# Patient Record
Sex: Female | Born: 1955 | Race: Black or African American | Hispanic: No | Marital: Single | Smoking: Never smoker
Health system: Southern US, Community
[De-identification: ages and names within clinical notes are randomized; demographics above are authoritative.]

## PROBLEM LIST (undated history)

## (undated) DIAGNOSIS — Z6841 Body Mass Index (BMI) 40.0 and over, adult: Secondary | ICD-10-CM

## (undated) DIAGNOSIS — I10 Essential (primary) hypertension: Secondary | ICD-10-CM

## (undated) DIAGNOSIS — G4730 Sleep apnea, unspecified: Secondary | ICD-10-CM

## (undated) HISTORY — DX: Body Mass Index (BMI) 40.0 and over, adult: Z684

## (undated) HISTORY — PX: LAPAROSCOPIC CHOLECYSTECTOMY: SUR755

## (undated) HISTORY — DX: Essential (primary) hypertension: I10

## (undated) HISTORY — PX: HYSTERECTOMY ABDOMINAL WITH SALPINGECTOMY: SHX6725

---

## 2013-05-26 HISTORY — PX: COLONOSCOPY: SHX174

## 2020-10-13 ENCOUNTER — Encounter: Payer: Self-pay | Admitting: Internal Medicine

## 2020-12-03 ENCOUNTER — Other Ambulatory Visit: Payer: Self-pay

## 2020-12-03 ENCOUNTER — Encounter: Payer: Self-pay | Admitting: Nurse Practitioner

## 2020-12-03 ENCOUNTER — Ambulatory Visit (INDEPENDENT_AMBULATORY_CARE_PROVIDER_SITE_OTHER): Payer: Medicare HMO | Admitting: Nurse Practitioner

## 2020-12-03 DIAGNOSIS — R935 Abnormal findings on diagnostic imaging of other abdominal regions, including retroperitoneum: Secondary | ICD-10-CM

## 2020-12-03 DIAGNOSIS — K219 Gastro-esophageal reflux disease without esophagitis: Secondary | ICD-10-CM | POA: Diagnosis not present

## 2020-12-03 DIAGNOSIS — K5900 Constipation, unspecified: Secondary | ICD-10-CM

## 2020-12-03 DIAGNOSIS — R109 Unspecified abdominal pain: Secondary | ICD-10-CM | POA: Insufficient documentation

## 2020-12-03 DIAGNOSIS — R1084 Generalized abdominal pain: Secondary | ICD-10-CM

## 2020-12-03 NOTE — Patient Instructions (Signed)
Your health issues we discussed today were:   Abdominal pain with known history of GERD (reflux/heartburn) and constipation: 1. Start taking over-the-counter Colace 100 mg once a day 2. You can use MiraLAX 1 dose 1-2 times a day as needed for any continued straining or hard stools 3. I have sent a new prescription for omeprazole 40 mg to take twice a day (you are currently on 20 mg once a day) 4. Take this pursing in the morning before you eat and 30 minutes before your last meal the day 5. We will request your previous colonoscopy from [facility] in [place], [place] 6. Call us for any worsening or severe symptoms  Abnormal CT of your liver: 1. I have started with some basic labs to evaluate your liver function and to check for hepatitis viruses 2. Someone will call to schedule the ultrasound of your liver to evaluate for how much scarring is there 3. Further recommendations will follow  Overall I recommend:  1. Continue other current medications 2. Return for follow-up in 4 weeks 3. Call us for any questions or concerns   ---------------------------------------------------------------  I am glad you have gotten your COVID-19 vaccination!  Even though you are fully vaccinated you should continue to follow CDC and state/local guidelines.  ---------------------------------------------------------------   At [facility] we value your feedback. You may receive a survey about your visit today. Please share your experience as we strive to create trusting relationships with our patients to provide genuine, compassionate, quality care.  We appreciate your understanding and patience as we review any laboratory studies, imaging, and other diagnostic tests that are ordered as we care for you. Our office policy is 5 business days for review of these results, and any emergent or urgent results are addressed in a timely manner for your best interest. If you do not hear from  our office in 1 week, please contact us.   We also encourage the use of MyChart, which contains your medical information for your review as well. If you are not enrolled in this feature, an access code is on this after visit summary for your convenience. Thank you for allowing [place] to be involved in your care.  It was great to see you today!  I hope you have a Merry Christmas and Happy Holidays!!

## 2020-12-03 NOTE — Progress Notes (Signed)
Referring Provider: Monico Blitz, MD Primary Care Physician:  Monico Blitz, MD Primary GI:  Dr. Abbey Chatters  Chief Complaint  Patient presents with  . Abdominal Pain    Left/right side pain, comes goes    HPI:   Amanda Hutchinson is a 64 y.o. female who presents on referral from primary care for abdominal pain.  Reviewed information provided with referral including primary care office visit dated 09/29/2020 wherein she complained of abdominal pain that was sudden in onset and persistent for about 1 month and increasing, described as moderate. Per PCP notes colonoscopy up to date 2014 and "normal". PCP notes also just irritable bowel syndrome in her past history. She apparently had a CT at [facility], although no report could be found in her forwarded documents.  Review of care everywhere finds a CT of the abdomen and pelvis with contrast dated 10/09/2020 with no acute intra-abdominal or pelvic pathology, no bowel obstruction, normal appendix. Noted fatty liver with findings of cirrhosis (specifically fatty infiltration with slight irregular liver contour). A 15 mm rounded hypodense structure to the left of the aorta may represent mildly enlarged lymph node and follow-up imaging recommended.  No liver function testing can be found in Care Everywhere.  No history of colonoscopy or EGD in our system.  Today she states she is doing okay overall. States pain started suddenly about 3 months ago, located generalized abdomen, sometimes sharp and sometimes dull, intermittent, occurs almost every day (more frequent then when it started). Identified trigger includes laying on her stomach, no identified alleviating factors; generally self-resolves. Typically lasts up to several hours. She has GERD, takes omeprazole 20 mg daily which helps somewhat. Still with breakthrough symptoms. Has a bowel movement about once a day to every other day. Stools variable from hard to soft, intermittent straining. Denies N/V,  hematochezia, melena, fever, chills, unintentional weight loss.  She states she has had fatty liver disease "for a long long time"  Had a colonoscopy in 2014 in [place], [place]. Only result she can remember is "small hernia". Can't remember repeat interval.  Denies NSAIDs. Previously on Linzess and stopped it this year.  Past Medical History:  Diagnosis Date  . BMI 40.0-44.9, adult ([place])   . Hypertension     Past Surgical History:  Procedure Laterality Date  . HYSTERECTOMY ABDOMINAL WITH SALPINGECTOMY    . LAPAROSCOPIC CHOLECYSTECTOMY      Current Outpatient Medications  Medication Sig Dispense Refill  . albuterol (VENTOLIN HFA) 108 (90 Base) MCG/ACT inhaler Inhale into the lungs every 6 (six) hours as needed for wheezing or shortness of breath.    Marland Kitchen amLODipine (NORVASC) 5 MG tablet Take 5 mg by mouth daily.    Marland Kitchen ammonium lactate (AMLACTIN) 12 % cream Apply topically in the morning and at bedtime.    Marland Kitchen atenolol (TENORMIN) 25 MG tablet Take by mouth daily.    . Azelastine HCl (ASTELIN NA) Place into the nose.    . benzonatate (TESSALON) 100 MG capsule Take by mouth 3 (three) times daily as needed for cough.    . Cholecalciferol (VITAMIN D) 50 MCG (2000 UT) CAPS Take by mouth.    . clobetasol cream (TEMOVATE) 3.66 % Apply 1 application topically 2 (two) times daily.    Marland Kitchen conjugated estrogens (PREMARIN) vaginal cream Place 1 Applicatorful vaginally daily.    . Diclofenac Sodium (DICLOFONO) 1.6 % GEL Apply topically.    Marland Kitchen estradiol (ESTRACE) 0.5 MG tablet Take 0.5 mg by mouth every other day.    Marland Kitchen  fluticasone (FLONASE) 50 MCG/ACT nasal spray Place 1 spray into both nostrils daily.    Marland Kitchen gabapentin (NEURONTIN) 100 MG capsule Take 100 mg by mouth at bedtime.    . gabapentin (NEURONTIN) 800 MG tablet Take 800 mg by mouth 3 (three) times daily.    Marland Kitchen HYDROcodone-acetaminophen (NORCO) 7.5-325 MG tablet Take 1 tablet by mouth in the morning, at noon, and at bedtime.    . Ibuprofen-Famotidine  800-26.6 MG TABS Take by mouth.    . levocetirizine (XYZAL) 5 MG tablet Take 5 mg by mouth every evening.    . Milnacipran (SAVELLA) 50 MG TABS tablet Take 50 mg by mouth 2 (two) times daily.    Marland Kitchen neomycin-bacitracin-polymyxin (NEOSPORIN) ointment Apply 1 application topically every 12 (twelve) hours.    Marland Kitchen omeprazole (PRILOSEC) 20 MG capsule Take 20 mg by mouth daily.    Marland Kitchen linaclotide (LINZESS) 145 MCG CAPS capsule Take 145 mcg by mouth daily before breakfast. (Patient not taking: Reported on 12/03/2020)     No current facility-administered medications for this visit.    Allergies as of 12/03/2020 - Review Complete 12/03/2020  Allergen Reaction Noted  . Ace inhibitors  03/13/2019  . Chlorthalidone  03/14/2019  . Diphenhydramine hcl  03/13/2019  . Sulfamethoxazole-trimethoprim  03/13/2019  . Tramadol  03/13/2019  . Oxycodone Rash and Other (See Comments) 03/13/2019    Family History  Problem Relation Age of Onset  . Colon cancer Neg Hx   . Liver disease Neg Hx     Social History   Socioeconomic History  . Marital status: Single    Spouse name: Not on file  . Number of children: Not on file  . Years of education: Not on file  . Highest education level: Not on file  Occupational History  . Not on file  Tobacco Use  . Smoking status: Never Smoker  . Smokeless tobacco: Never Used  Substance and Sexual Activity  . Alcohol use: Never  . Drug use: Never  . Sexual activity: Not on file  Other Topics Concern  . Not on file  Social History Narrative  . Not on file   Social Determinants of Health   Financial Resource Strain: Not on file  Food Insecurity: Not on file  Transportation Needs: Not on file  Physical Activity: Not on file  Stress: Not on file  Social Connections: Not on file    Subjective: Review of Systems  Constitutional: Negative for chills, fever, malaise/fatigue and weight loss.  HENT: Negative for congestion and sore throat.   Respiratory: Negative for  cough and shortness of breath.   Cardiovascular: Negative for chest pain and palpitations.  Gastrointestinal: Positive for abdominal pain, constipation and heartburn. Negative for blood in stool, diarrhea, melena, nausea and vomiting.  Musculoskeletal: Negative for joint pain and myalgias.  Skin: Negative for rash.  Neurological: Negative for dizziness and weakness.  Endo/Heme/Allergies: Does not bruise/bleed easily.  Psychiatric/Behavioral: Negative for depression. The patient is not nervous/anxious.   All other systems reviewed and are negative.    Objective: BP (!) 146/80   Pulse 72   Temp (!) 96.9 F (36.1 C)   Ht 5\' 3"  (1.6 m)   Wt 239 lb 9.6 oz (108.7 kg)   BMI 42.44 kg/m  Physical Exam Vitals and nursing note reviewed.  Constitutional:      General: She is not in acute distress.    Appearance: Normal appearance. She is well-developed. She is obese. She is not ill-appearing, toxic-appearing or diaphoretic.  HENT:     Head: Normocephalic and atraumatic.     Nose: No congestion or rhinorrhea.  Eyes:     General: No scleral icterus. Cardiovascular:     Rate and Rhythm: Normal rate and regular rhythm.     Heart sounds: Normal heart sounds.  Pulmonary:     Effort: Pulmonary effort is normal. No respiratory distress.     Breath sounds: Normal breath sounds.  Abdominal:     General: Bowel sounds are normal.     Palpations: Abdomen is soft. There is no hepatomegaly, splenomegaly or mass.     Tenderness: There is generalized abdominal tenderness. There is no guarding or rebound.     Hernia: No hernia is present.  Skin:    General: Skin is warm and dry.     Coloration: Skin is not jaundiced.     Findings: No rash.  Neurological:     General: No focal deficit present.     Mental Status: She is alert and oriented to person, place, and time.  Psychiatric:        Attention and Perception: Attention normal.        Mood and Affect: Mood normal.        Speech: Speech normal.         Behavior: Behavior normal.        Thought Content: Thought content normal.        Cognition and Memory: Cognition and memory normal.      Assessment:  Very pleasant 64 year old female presents for abdominal pain in the setting of GERD and constipation.  Also noted abnormal CT with fatty liver disease (which she states she has had for "many years") now with findings suggestive of early cirrhosis.  No red flag/warning signs or symptoms.  Abdominal pain with GERD: Omeprazole 20 mg somewhat effective but not very effective.  She does have intermittent GERD symptoms and when starting PPI her abdominal pain did improve some.  At this point I will have her increase her PPI to 40 mg twice daily to see if she can squeeze any more effect out of this.  GERD is a potential contributing etiology to her pain.  Abdominal pain with constipation: Noted bowel movement every 1 to 2 days although sometimes hard, sometimes consistent with [place] 4.  Previously on Linzess so she stopped this.  She is not currently on any significant constipation medications.  At this point I will trial her on Colace 100 mg once daily and recommend MiraLAX 1-2 times a day as needed for breakthrough constipation.  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  Abdominal pain is likely multifactorial in nature.  As per above, we will try to better treat her GERD and her constipation to see if we can get any improvement in her pain.  I will also further work-up her abnormal CT concerning for early cirrhosis.  I will also request her last colonoscopy from around 2014 for informational purposes and decide if colonoscopy is needed.  Abnormal CT: Care everywhere, fatty liver disease with slightly nodular contour suggestive of early cirrhosis.  She states she has had fatty liver disease for a number of years.  No LFTs could  be found in our system or in Care Everywhere.  At this point I will plan a somewhat limited liver work-up including CBC, CMP, viral hepatitis panel.  I will also plan to  schedule a right upper quadrant ultrasound with elastography to gauge degree of fibrosis.  Further recommendations to follow.  She may need more extensive liver work-up with autoimmune serologies, ferritin, ceruloplasmin, possibly alpha-1 antitrypsin levels pending results.   Plan: 1. Request previous colonoscopy from 2014 from [facility] 2. Increase omeprazole 40 mg twice daily 3. Colace 100 mg once daily 4. MiraLAX 1-2 times a day as needed for breakthrough constipation 5. CBC, CMP, viral hepatitis panel including hepatitis A/B/C 6. Right upper quadrant ultrasound with liver elastography 7. Follow-up in 4 weeks 8. Call for worsening or severe symptoms    Thank you for allowing [place] to participate in the care of [place], DNP, AGNP-C Adult & Gerontological Nurse Practitioner [facility]   12/08/2020 10:38 AM   Disclaimer: This note was dictated with voice recognition software. Similar sounding words can inadvertently be transcribed and may not be corrected upon review.

## 2020-12-04 ENCOUNTER — Encounter: Payer: Self-pay | Admitting: Internal Medicine

## 2020-12-04 ENCOUNTER — Telehealth: Payer: Self-pay | Admitting: Internal Medicine

## 2020-12-04 DIAGNOSIS — K219 Gastro-esophageal reflux disease without esophagitis: Secondary | ICD-10-CM

## 2020-12-04 NOTE — Telephone Encounter (Signed)
Please tell the patient she is currently on Prilosec (omeprazole) and my intention was to increase the dose (not change to pantoprazole). Is she wanting to stay on the lower dose?

## 2020-12-04 NOTE — Progress Notes (Signed)
Cc'ed to pcp °

## 2020-12-04 NOTE — Telephone Encounter (Signed)
[phone] patient, was here yesterday,and called and  wants to stay on pantoprozole   Uses [facility] in [place]

## 2020-12-04 NOTE — Telephone Encounter (Signed)
Randall Hiss this pt phoned this morning stating she wants to stay on Pantoprozole but her chart states Omeprazole but in any case nothing was sent in. Please advise. (she wants it sent to [facility] in [place]).

## 2020-12-07 NOTE — Telephone Encounter (Signed)
Amanda Hutchinson I phoned and spoke with the patient she was on Omperazole but it made her sick therefore she was prescribed Pantoprozole and wants to stay on it and nothing is wrong with the dose of that which she is taking. Please send in Rx for Pantoprozole.

## 2020-12-08 ENCOUNTER — Ambulatory Visit (HOSPITAL_COMMUNITY)
Admission: RE | Admit: 2020-12-08 | Discharge: 2020-12-08 | Disposition: A | Discharge status: Home / Self Care | Payer: Medicare HMO | Source: Ambulatory Visit | Attending: Nurse Practitioner | Admitting: Nurse Practitioner

## 2020-12-08 ENCOUNTER — Other Ambulatory Visit: Payer: Self-pay

## 2020-12-08 DIAGNOSIS — K219 Gastro-esophageal reflux disease without esophagitis: Secondary | ICD-10-CM | POA: Insufficient documentation

## 2020-12-08 DIAGNOSIS — R935 Abnormal findings on diagnostic imaging of other abdominal regions, including retroperitoneum: Secondary | ICD-10-CM | POA: Diagnosis present

## 2020-12-08 DIAGNOSIS — R1084 Generalized abdominal pain: Secondary | ICD-10-CM | POA: Diagnosis present

## 2020-12-08 DIAGNOSIS — K5900 Constipation, unspecified: Secondary | ICD-10-CM

## 2020-12-09 LAB — CBC WITH DIFFERENTIAL/PLATELET
Absolute Monocytes: 531 cells/uL (ref 200–950)
Basophils Absolute: 32 cells/uL (ref 0–200)
Basophils Relative: 0.5 %
Eosinophils Absolute: 474 cells/uL (ref 15–500)
Eosinophils Relative: 7.4 %
HCT: 36.4 % (ref 35.0–45.0)
Hemoglobin: 12.1 g/dL (ref 11.7–15.5)
Lymphs Abs: 2221 cells/uL (ref 850–3900)
MCH: 29.1 pg (ref 27.0–33.0)
MCHC: 33.2 g/dL (ref 32.0–36.0)
MCV: 87.5 fL (ref 80.0–100.0)
MPV: 9.7 fL (ref 7.5–12.5)
Monocytes Relative: 8.3 %
Neutro Abs: 3142 cells/uL (ref 1500–7800)
Neutrophils Relative %: 49.1 %
Platelets: 361 10*3/uL (ref 140–400)
RBC: 4.16 10*6/uL (ref 3.80–5.10)
RDW: 12.4 % (ref 11.0–15.0)
Total Lymphocyte: 34.7 %
WBC: 6.4 10*3/uL (ref 3.8–10.8)

## 2020-12-09 LAB — COMPREHENSIVE METABOLIC PANEL
AG Ratio: 1.7 (calc) (ref 1.0–2.5)
ALT: 22 U/L (ref 6–29)
AST: 18 U/L (ref 10–35)
Albumin: 4.3 g/dL (ref 3.6–5.1)
Alkaline phosphatase (APISO): 110 U/L (ref 37–153)
BUN: 17 mg/dL (ref 7–25)
CO2: 29 mmol/L (ref 20–32)
Calcium: 8.9 mg/dL (ref 8.6–10.4)
Chloride: 106 mmol/L (ref 98–110)
Creat: 0.92 mg/dL (ref 0.50–0.99)
Globulin: 2.6 g/dL (calc) (ref 1.9–3.7)
Glucose, Bld: 88 mg/dL (ref 65–99)
Potassium: 4.4 mmol/L (ref 3.5–5.3)
Sodium: 142 mmol/L (ref 135–146)
Total Bilirubin: 0.3 mg/dL (ref 0.2–1.2)
Total Protein: 6.9 g/dL (ref 6.1–8.1)

## 2020-12-09 LAB — HEPATITIS B SURFACE ANTIBODY,QUALITATIVE: Hep B S Ab: BORDERLINE — AB

## 2020-12-09 LAB — HEPATITIS A ANTIBODY, TOTAL: Hepatitis A AB,Total: NONREACTIVE

## 2020-12-09 LAB — HEPATITIS C ANTIBODY
Hepatitis C Ab: NONREACTIVE
SIGNAL TO CUT-OFF: 0.01 (ref <1.00)

## 2020-12-09 LAB — HEPATITIS B SURFACE ANTIGEN: Hepatitis B Surface Ag: NONREACTIVE

## 2020-12-09 LAB — HEPATITIS B CORE ANTIBODY, TOTAL: Hep B Core Total Ab: NONREACTIVE

## 2020-12-09 NOTE — Telephone Encounter (Signed)
Spoke with pt. Pt found the bottle of Pantoprazole that she was taking. Her PCP prescribed Pantoprazole 40 mg once daily. Pt would like to know if the dose can be increased to twice daily since she was still belching and having liquid come back up in her mouth.

## 2020-12-09 NOTE — Telephone Encounter (Signed)
I don't see any history of Protonix in her chart. Not sure who Rx'd it. I'm ok with Protonix, but would like to know what dose/frequency she had previously taken that was effective.

## 2020-12-10 MED ORDER — PANTOPRAZOLE SODIUM 40 MG PO TBEC: 40.0000 mg | DELAYED_RELEASE_TABLET | Freq: Two times a day (BID) | ORAL | 5 refills | Status: DC

## 2020-12-10 NOTE — Telephone Encounter (Signed)
Yes, we can try this. I sent in an Rx for this to her pharmacy. Call with progress report in 2-4 weeks.

## 2020-12-10 NOTE — Telephone Encounter (Signed)
Left a detailed message and asked pt to call in 2-4 weeks with an update.

## 2020-12-10 NOTE — Addendum Note (Signed)
Addended by: Gordy Levan, Signa Cheek A on: 12/10/2020 09:52 AM   Modules accepted: Orders

## 2021-01-26 ENCOUNTER — Ambulatory Visit: Payer: Medicare HMO | Admitting: Nurse Practitioner

## 2021-01-26 ENCOUNTER — Encounter: Payer: Self-pay | Admitting: Nurse Practitioner

## 2021-01-26 ENCOUNTER — Other Ambulatory Visit: Payer: Self-pay

## 2021-01-26 ENCOUNTER — Encounter: Payer: Self-pay | Admitting: *Deleted

## 2021-01-26 VITALS — BP 141/68 | HR 68 | Temp 97.1°F | Ht 63.0 in | Wt 243.2 lb

## 2021-01-26 DIAGNOSIS — R1310 Dysphagia, unspecified: Secondary | ICD-10-CM | POA: Insufficient documentation

## 2021-01-26 DIAGNOSIS — K5900 Constipation, unspecified: Secondary | ICD-10-CM | POA: Diagnosis not present

## 2021-01-26 DIAGNOSIS — K219 Gastro-esophageal reflux disease without esophagitis: Secondary | ICD-10-CM | POA: Diagnosis not present

## 2021-01-26 DIAGNOSIS — R195 Other fecal abnormalities: Secondary | ICD-10-CM | POA: Insufficient documentation

## 2021-01-26 DIAGNOSIS — R1319 Other dysphagia: Secondary | ICD-10-CM

## 2021-01-26 NOTE — Progress Notes (Signed)
Referring Provider: Monico Blitz, MD Primary Care Physician:  Monico Blitz, MD Primary GI:  Dr. Abbey Chatters  Chief Complaint  Patient presents with  . Gastroesophageal Reflux    Doing okay.    HPI:   Amanda Hutchinson is a 65 y.o. female who presents for follow-up on GERD and constipation.  Today she states she doing okay overall. GERD doing well on Protonix 40 mg bid. She has had issues "getting strangled" almost every meal now. She had this issue "a long time ago" but self-resolved. She has a chronic GERD for years. No regurgitation, food generally passes with time. Sometimes issues even with water. Also occasional pill dysphagia. Has associated odynophagia. Rare abdominal discomfort. She did have a couple episodes of dark stools after drinking black tea and a lot of greens. She is on diclofenac, no OTC NSAIDs/ASA powders. She does use intermittent cough syrup, mixes it up (not sure which she typically uses). Denies N/V, hematochezia, fever, chills, unintentional weight loss. Denies URI or flu-like symptoms. Denies loss of sense of taste or smell. The patient has received COVID-19 vaccination(s). Denies chest pain, dyspnea, dizziness, lightheadedness, syncope, near syncope. Denies any other upper or lower GI symptoms.  Past Medical History:  Diagnosis Date  . BMI 40.0-44.9, adult ([place])   . Hypertension     Past Surgical History:  Procedure Laterality Date  . HYSTERECTOMY ABDOMINAL WITH SALPINGECTOMY    . LAPAROSCOPIC CHOLECYSTECTOMY      Current Outpatient Medications  Medication Sig Dispense Refill  . albuterol (VENTOLIN HFA) 108 (90 Base) MCG/ACT inhaler Inhale into the lungs every 6 (six) hours as needed for wheezing or shortness of breath.    Marland Kitchen amLODipine (NORVASC) 5 MG tablet Take 5 mg by mouth daily.    Marland Kitchen ammonium lactate (AMLACTIN) 12 % cream Apply topically in the morning and at bedtime.    Marland Kitchen atenolol (TENORMIN) 25 MG tablet Take by mouth daily.    . Azelastine HCl (ASTELIN  NA) Place into the nose.    . benzonatate (TESSALON) 100 MG capsule Take by mouth 3 (three) times daily as needed for cough.    . Cholecalciferol (VITAMIN D) 50 MCG (2000 UT) CAPS Take by mouth.    . clobetasol cream (TEMOVATE) 7.68 % Apply 1 application topically 2 (two) times daily.    Marland Kitchen conjugated estrogens (PREMARIN) vaginal cream Place 1 Applicatorful vaginally daily.    . Diclofenac Sodium (DICLOFONO) 1.6 % GEL Apply topically.    . docusate sodium (COLACE) 100 MG capsule Take 100 mg by mouth daily.    Marland Kitchen estradiol (ESTRACE) 0.5 MG tablet Take 0.5 mg by mouth every other day.    . fluticasone (FLONASE) 50 MCG/ACT nasal spray Place 1 spray into both nostrils daily.    Marland Kitchen gabapentin (NEURONTIN) 100 MG capsule Take 100 mg by mouth at bedtime.    . gabapentin (NEURONTIN) 800 MG tablet Take 800 mg by mouth 3 (three) times daily.    Marland Kitchen HYDROcodone-acetaminophen (NORCO) 7.5-325 MG tablet Take 1 tablet by mouth in the morning, at noon, and at bedtime.    . Ibuprofen-Famotidine 800-26.6 MG TABS Take by mouth.    . levocetirizine (XYZAL) 5 MG tablet Take 5 mg by mouth every evening.    . Milnacipran (SAVELLA) 50 MG TABS tablet Take 50 mg by mouth 2 (two) times daily.    Marland Kitchen neomycin-bacitracin-polymyxin (NEOSPORIN) ointment Apply 1 application topically every 12 (twelve) hours.    . pantoprazole (PROTONIX) 40 MG tablet Take 1  tablet (40 mg total) by mouth 2 (two) times daily before a meal. 60 tablet 5   No current facility-administered medications for this visit.    Allergies as of 01/26/2021 - Review Complete 01/26/2021  Allergen Reaction Noted  . Ace inhibitors  03/13/2019  . Chlorthalidone  03/14/2019  . Diphenhydramine hcl  03/13/2019  . Sulfamethoxazole-trimethoprim  03/13/2019  . Tramadol  03/13/2019  . Oxycodone Rash and Other (See Comments) 03/13/2019    Family History  Problem Relation Age of Onset  . Colon cancer Neg Hx   . Liver disease Neg Hx     Social History    Socioeconomic History  . Marital status: Single    Spouse name: Not on file  . Number of children: Not on file  . Years of education: Not on file  . Highest education level: Not on file  Occupational History  . Not on file  Tobacco Use  . Smoking status: Never Smoker  . Smokeless tobacco: Never Used  Substance and Sexual Activity  . Alcohol use: Never  . Drug use: Never  . Sexual activity: Not on file  Other Topics Concern  . Not on file  Social History Narrative  . Not on file   Social Determinants of Health   Financial Resource Strain: Not on file  Food Insecurity: Not on file  Transportation Needs: Not on file  Physical Activity: Not on file  Stress: Not on file  Social Connections: Not on file    Subjective: Review of Systems  Constitutional: Negative for chills, fever, malaise/fatigue and weight loss.  HENT: Negative for congestion and sore throat.   Respiratory: Negative for cough and shortness of breath.   Cardiovascular: Negative for chest pain and palpitations.  Gastrointestinal: Negative for abdominal pain, blood in stool, diarrhea, heartburn, melena, nausea and vomiting.       Dysphagia  Musculoskeletal: Negative for joint pain and myalgias.  Skin: Negative for rash.  Neurological: Negative for dizziness and weakness.  Endo/Heme/Allergies: Does not bruise/bleed easily.  Psychiatric/Behavioral: Negative for depression. The patient is not nervous/anxious.   All other systems reviewed and are negative.    Objective: BP (!) 141/68   Pulse 68   Temp (!) 97.1 F (36.2 C)   Ht _0  (1.6 m)   Wt 243 lb 3.2 oz (110.3 kg)   BMI 43.08 kg/m  Physical Exam Vitals and nursing note reviewed.  Constitutional:      General: She is not in acute distress.    Appearance: Normal appearance. She is well-developed. She is obese. She is not ill-appearing, toxic-appearing or diaphoretic.  HENT:     Head: Normocephalic and atraumatic.     Nose: No congestion or  rhinorrhea.  Eyes:     General: No scleral icterus. Cardiovascular:     Rate and Rhythm: Normal rate and regular rhythm.     Heart sounds: Normal heart sounds.  Pulmonary:     Effort: Pulmonary effort is normal. No respiratory distress.     Breath sounds: Normal breath sounds.  Abdominal:     General: Bowel sounds are normal.     Palpations: Abdomen is soft. There is no hepatomegaly, splenomegaly or mass.     Tenderness: There is no abdominal tenderness. There is no guarding or rebound.     Hernia: No hernia is present.  Skin:    General: Skin is warm and dry.     Coloration: Skin is not jaundiced.     Findings: No rash.  Neurological:     General: No focal deficit present.     Mental Status: She is alert and oriented to person, place, and time.  Psychiatric:        Attention and Perception: Attention normal.        Mood and Affect: Mood normal.        Speech: Speech normal.        Behavior: Behavior normal.        Thought Content: Thought content normal.        Cognition and Memory: Cognition and memory normal.      Assessment:  Very pleasant 65 year old female presents for follow-up on GERD and constipation.  Currently she is doing well in these regards.  However, she has developed some solid food and pill dysphagia associated with odynophagia as described above.  Dysphagia: Almost every meal, solid foods and occasionally pills.  Sometimes even water.  She has had this before and cannot remember she had a procedure for it.  She has not had an issue in a number of years.  She has have a chronic multiyear history of GERD.  We will proceed with endoscopy to evaluate and treat  GERD: Currently doing well on her PPI regimen.  Recommend continue current meds and call us for problems  Dark stools: She does note dark stools, but does eat a lot of greens and is not sure if this is related.  She is on meloxicam and ibuprofen (combined with famotidine) so erosive reflux esophagitis,  gastritis, duodenitis would not be unreasonable to expect.  Further evaluation by EGD  Constipation: Currently seems to be resolved with no issues described.   Proceed with EGD on propofol/MAC with Dr. Abbey Chatters on propofol/MAC in near future: the risks, benefits, and alternatives have been discussed with the patient in detail. The patient states understanding and desires to proceed.  ASA III   Plan: 1. Continue current medications 2. EGD as described above 3. Dysphagia 3 diet in the interim 4. Follow-up in 3 months 5. Call for worsening or severe symptoms    Thank you for allowing [place] to participate in the care of [place], DNP, AGNP-C Adult & Gerontological Nurse Practitioner [facility]   01/26/2021 11:54 AM   Disclaimer: This note was dictated with voice recognition software. Similar sounding words can inadvertently be transcribed and may not be corrected upon review.

## 2021-01-26 NOTE — Patient Instructions (Signed)
Your health issues we discussed today were:   GERD (reflux/heartburn): 1. I am glad you are doing well! 2. Continue taking your current medicines 3. Call us for any worsening symptoms  Dysphagia (swallowing difficulties): 1. I am printing information below related to soft foods to help prevent food from getting stuck 2. Try to eat softer foods while we wait for your procedure 3. We will schedule an upper endoscopy with possible dilation to help with your swallowing difficulties 4. If you get stuck and will not go forward, will not come back up, and last for more than 2 hours then proceed to the emergency room 5. Call us for any worsening or severe symptoms  Dark stools: 1. This could be due to the food you are eating or could be due to minor bleeding from your stomach 2. The upper endoscopy that we are doing for your swallowing difficulties will help evaluate for any problems 3. Call us if you have any worsening or severe symptoms.  Call us especially if you have worsening weakness, fatigue, shortness of breath, dizziness, passing out, nearly passing out, etc.  Overall I recommend:  1. Continue other current medications 2. Return for follow-up in 3 months 3. Call us for any questions or concerns   ---------------------------------------------------------------  I am glad you have gotten your COVID-19 vaccination!  Even though you are fully vaccinated you should continue to follow CDC and state/local guidelines.  ---------------------------------------------------------------   At [facility] we value your feedback. You may receive a survey about your visit today. Please share your experience as we strive to create trusting relationships with our patients to provide genuine, compassionate, quality care.  We appreciate your understanding and patience as we review any laboratory studies, imaging, and other diagnostic tests that are ordered as we care for you. Our office  policy is 5 business days for review of these results, and any emergent or urgent results are addressed in a timely manner for your best interest. If you do not hear from our office in 1 week, please contact us.   We also encourage the use of MyChart, which contains your medical information for your review as well. If you are not enrolled in this feature, an access code is on this after visit summary for your convenience. Thank you for allowing [place] to be involved in your care.  It was great to see you today!  I hope you have a safe and warm winter!!

## 2021-01-27 ENCOUNTER — Encounter: Payer: Self-pay | Admitting: *Deleted

## 2021-02-25 NOTE — Patient Instructions (Signed)
Amanda Hutchinson  02/25/2021     @PREFPERIOPPHARMACY @   Your procedure is scheduled on  03/02/2021.   Report to [facility] at  0700  A.M.   Call this number if you have problems the morning of surgery:  [phone]     Remember:  Follow the diet instructions given to you by the office.                      Take these medicines the morning of surgery with A SIP OF WATER  Amlodipine, atenolol, hydrocodone(if needed), savella, protonix.  Use your inhaler before you come and bring your rescue inhaler with you.    Please brush your teeth.  Do not wear jewelry, make-up or nail polish.  Do not wear lotions, powders, or perfumes, or deodorant.  Do not shave 48 hours prior to surgery.  Men may shave face and neck.  Do not bring valuables to the hospital.  [facility] is not responsible for any belongings or valuables.  Contacts, dentures or bridgework may not be worn into surgery.  Leave your suitcase in the car.  After surgery it may be brought to your room.  For patients admitted to the hospital, discharge time will be determined by your treatment team.  Patients discharged the day of surgery will not be allowed to drive home and must have someone with them for 24 hours.     Special instructions:  DO NOT smoke tobacco or vape the morning of your procedure.   Please read over the following fact sheets that you were given. Anesthesia Post-op Instructions and Care and Recovery After Surgery       [email or web address]">  Esophageal Dilatation Esophageal dilatation, also called esophageal dilation, is a procedure to widen or open a blocked or narrowed part of the esophagus. The esophagus is the part of the body that moves food and liquid from the mouth to the stomach. You may need this procedure if:  You have a buildup of scar tissue in your esophagus that makes it difficult, painful, or impossible  to swallow. This can be caused by gastroesophageal reflux disease (GERD).  You have cancer of the esophagus.  There is a problem with how food moves through your esophagus. In some cases, you may need this procedure repeated at a later time to dilate the esophagus gradually. Tell a health care provider about:  Any allergies you have.  All medicines you are taking, including vitamins, herbs, eye drops, creams, and over-the-counter medicines.  Any problems you or family members have had with anesthetic medicines.  Any blood disorders you have.  Any surgeries you have had.  Any medical conditions you have.  Any antibiotic medicines you are required to take before dental procedures.  Whether you are pregnant or may be pregnant. What are the risks? Generally, this is a safe procedure. However, problems may occur, including:  Bleeding due to a tear in the lining of the esophagus.  A hole, or perforation, in the esophagus. What happens before the procedure?  Ask your health care provider about: ? Changing or stopping your regular medicines. This is especially important if you are taking diabetes medicines or blood thinners. ? Taking medicines such as aspirin and ibuprofen. These medicines can thin your blood. Do not take these medicines unless your health care provider tells you to take them. ? Taking over-the-counter medicines, vitamins, herbs, and supplements.  Follow instructions from your health care provider  about eating or drinking restrictions.  Plan to have a responsible adult take you home from the hospital or clinic.  Plan to have a responsible adult care for you for the time you are told after you leave the hospital or clinic. This is important. What happens during the procedure?  You may be given a medicine to help you relax (sedative).  A numbing medicine may be sprayed into the back of your throat, or you may gargle the medicine.  Your health care provider may  perform the dilatation using various surgical instruments, such as: ? Simple dilators. This instrument is carefully placed in the esophagus to stretch it. ? Guided wire bougies. This involves using an endoscope to insert a wire into the esophagus. A dilator is passed over this wire to enlarge the esophagus. Then the wire is removed. ? Balloon dilators. An endoscope with a small balloon is inserted into the esophagus. The balloon is inflated to stretch the esophagus and open it up. The procedure may vary among health care providers and hospitals. What can I expect after the procedure?  Your blood pressure, heart rate, breathing rate, and blood oxygen level will be monitored until you leave the hospital or clinic.  Your throat may feel slightly sore and numb. This will get better over time.  You will not be allowed to eat or drink until your throat is no longer numb.  When you are able to drink, urinate, and sit on the edge of the bed without nausea or dizziness, you may be able to return home. Follow these instructions at home:  Take over-the-counter and prescription medicines only as told by your health care provider.  If you were given a sedative during the procedure, it can affect you for several hours. Do not drive or operate machinery until your health care provider says that it is safe.  Plan to have a responsible adult care for you for the time you are told. This is important.  Follow instructions from your health care provider about any eating or drinking restrictions.  Do not use any products that contain nicotine or tobacco, such as cigarettes, e-cigarettes, and chewing tobacco. If you need help quitting, ask your health care provider.  Keep all follow-up visits. This is important. Contact a health care provider if:  You have a fever.  You have pain that is not relieved by medicine. Get help right away if:  You have chest pain.  You have trouble breathing.  You have  trouble swallowing.  You vomit blood.  You have black, tarry, or bloody stools. These symptoms may represent a serious problem that is an emergency. Do not wait to see if the symptoms will go away. Get medical help right away. Call your local emergency services (911 in the [place] Do not drive yourself to the hospital. Summary  Esophageal dilatation, also called esophageal dilation, is a procedure to widen or open a blocked or narrowed part of the esophagus.  Plan to have a responsible adult take you home from the hospital or clinic.  For this procedure, a numbing medicine may be sprayed into the back of your throat, or you may gargle the medicine.  Do not drive or operate machinery until your health care provider says that it is safe. This information is not intended to replace advice given to you by your health care provider. Make sure you discuss any questions you have with your health care provider. Document Revised: 04/29/2020 Document Reviewed: 04/29/2020 Elsevier Patient  Education  [place] After This sheet gives you information about how to care for yourself after your procedure. Your health care provider may also give you more specific instructions. If you have problems or questions, contact your health care provider. What can I expect after the procedure? After the procedure, it is common to have:  Tiredness.  Forgetfulness about what happened after the procedure.  Impaired judgment for important decisions.  Nausea or vomiting.  Some difficulty with balance. Follow these instructions at home: For the time period you were told by your health care provider:  Rest as needed.  Do not participate in activities where you could fall or become injured.  Do not drive or use machinery.  Do not drink alcohol.  Do not take sleeping pills or medicines that cause drowsiness.  Do not make important decisions or sign legal  documents.  Do not take care of children on your own.      Eating and drinking  Follow the diet that is recommended by your health care provider.  Drink enough fluid to keep your urine pale yellow.  If you vomit: ? Drink water, juice, or soup when you can drink without vomiting. ? Make sure you have little or no nausea before eating solid foods. General instructions  Have a responsible adult stay with you for the time you are told. It is important to have someone help care for you until you are awake and alert.  Take over-the-counter and prescription medicines only as told by your health care provider.  If you have sleep apnea, surgery and certain medicines can increase your risk for breathing problems. Follow instructions from your health care provider about wearing your sleep device: ? Anytime you are sleeping, including during daytime naps. ? While taking prescription pain medicines, sleeping medicines, or medicines that make you drowsy.  Avoid smoking.  Keep all follow-up visits as told by your health care provider. This is important. Contact a health care provider if:  You keep feeling nauseous or you keep vomiting.  You feel light-headed.  You are still sleepy or having trouble with balance after 24 hours.  You develop a rash.  You have a fever.  You have redness or swelling around the IV site. Get help right away if:  You have trouble breathing.  You have new-onset confusion at home. Summary  For several hours after your procedure, you may feel tired. You may also be forgetful and have poor judgment.  Have a responsible adult stay with you for the time you are told. It is important to have someone help care for you until you are awake and alert.  Rest as told. Do not drive or operate machinery. Do not drink alcohol or take sleeping pills.  Get help right away if you have trouble breathing, or if you suddenly become confused. This information is not  intended to replace advice given to you by your health care provider. Make sure you discuss any questions you have with your health care provider. Document Revised: 08/27/2020 Document Reviewed: 11/14/2019 Elsevier Patient Education  2021 [employer].

## 2021-03-01 ENCOUNTER — Encounter (HOSPITAL_COMMUNITY): Payer: Self-pay

## 2021-03-01 ENCOUNTER — Other Ambulatory Visit (HOSPITAL_COMMUNITY)
Admission: RE | Admit: 2021-03-01 | Discharge: 2021-03-01 | Disposition: A | Discharge status: Home / Self Care | Payer: Medicare HMO | Source: Ambulatory Visit | Attending: Internal Medicine | Admitting: Internal Medicine

## 2021-03-01 ENCOUNTER — Encounter (HOSPITAL_COMMUNITY)
Admission: RE | Admit: 2021-03-01 | Discharge: 2021-03-01 | Disposition: A | Discharge status: Home / Self Care | Payer: Medicare HMO | Source: Ambulatory Visit | Attending: Internal Medicine | Admitting: Internal Medicine

## 2021-03-01 ENCOUNTER — Other Ambulatory Visit: Payer: Self-pay

## 2021-03-01 DIAGNOSIS — Z01812 Encounter for preprocedural laboratory examination: Secondary | ICD-10-CM | POA: Diagnosis present

## 2021-03-01 DIAGNOSIS — Z20822 Contact with and (suspected) exposure to covid-19: Secondary | ICD-10-CM | POA: Insufficient documentation

## 2021-03-01 DIAGNOSIS — Z01818 Encounter for other preprocedural examination: Secondary | ICD-10-CM | POA: Diagnosis not present

## 2021-03-01 HISTORY — DX: Sleep apnea, unspecified: G47.30

## 2021-03-01 LAB — SARS CORONAVIRUS 2 (TAT 6-24 HRS): SARS Coronavirus 2: NEGATIVE

## 2021-03-02 ENCOUNTER — Ambulatory Visit (HOSPITAL_COMMUNITY): Payer: Medicare HMO | Admitting: Anesthesiology

## 2021-03-02 ENCOUNTER — Encounter (HOSPITAL_COMMUNITY): Payer: Self-pay

## 2021-03-02 ENCOUNTER — Encounter (HOSPITAL_COMMUNITY)
Admission: RE | Disposition: A | Discharge status: Home / Self Care | Payer: Self-pay | Source: Home / Self Care | Attending: Internal Medicine

## 2021-03-02 ENCOUNTER — Ambulatory Visit (HOSPITAL_COMMUNITY)
Admission: RE | Admit: 2021-03-02 | Discharge: 2021-03-02 | Disposition: A | Discharge status: Home / Self Care | Payer: Medicare HMO | Attending: Internal Medicine | Admitting: Internal Medicine

## 2021-03-02 DIAGNOSIS — R1310 Dysphagia, unspecified: Secondary | ICD-10-CM

## 2021-03-02 DIAGNOSIS — K2970 Gastritis, unspecified, without bleeding: Secondary | ICD-10-CM | POA: Diagnosis not present

## 2021-03-02 DIAGNOSIS — K219 Gastro-esophageal reflux disease without esophagitis: Secondary | ICD-10-CM | POA: Insufficient documentation

## 2021-03-02 DIAGNOSIS — K319 Disease of stomach and duodenum, unspecified: Secondary | ICD-10-CM | POA: Insufficient documentation

## 2021-03-02 DIAGNOSIS — Z885 Allergy status to narcotic agent status: Secondary | ICD-10-CM | POA: Diagnosis not present

## 2021-03-02 DIAGNOSIS — Z79891 Long term (current) use of opiate analgesic: Secondary | ICD-10-CM | POA: Diagnosis not present

## 2021-03-02 DIAGNOSIS — Z882 Allergy status to sulfonamides status: Secondary | ICD-10-CM | POA: Insufficient documentation

## 2021-03-02 DIAGNOSIS — Z79899 Other long term (current) drug therapy: Secondary | ICD-10-CM | POA: Insufficient documentation

## 2021-03-02 DIAGNOSIS — R112 Nausea with vomiting, unspecified: Secondary | ICD-10-CM | POA: Insufficient documentation

## 2021-03-02 DIAGNOSIS — K222 Esophageal obstruction: Secondary | ICD-10-CM | POA: Diagnosis not present

## 2021-03-02 DIAGNOSIS — Z888 Allergy status to other drugs, medicaments and biological substances status: Secondary | ICD-10-CM | POA: Insufficient documentation

## 2021-03-02 HISTORY — PX: BALLOON DILATION: SHX5330

## 2021-03-02 HISTORY — PX: ESOPHAGOGASTRODUODENOSCOPY (EGD) WITH PROPOFOL: SHX5813

## 2021-03-02 HISTORY — PX: BIOPSY: SHX5522

## 2021-03-02 SURGERY — ESOPHAGOGASTRODUODENOSCOPY (EGD) WITH PROPOFOL
Anesthesia: General

## 2021-03-02 MED ORDER — LIDOCAINE HCL (PF) 2 % IJ SOLN
INTRAMUSCULAR | Status: AC
  Filled 2021-03-02: qty 5

## 2021-03-02 MED ORDER — LIDOCAINE VISCOUS HCL 2 % MT SOLN: 15.0000 mL | Freq: Once | OROMUCOSAL | Status: AC

## 2021-03-02 MED ORDER — STERILE WATER FOR IRRIGATION IR SOLN
Status: DC | PRN
  Administered 2021-03-02: 100 mL

## 2021-03-02 MED ORDER — LACTATED RINGERS IV SOLN: INTRAVENOUS | Status: DC

## 2021-03-02 MED ORDER — LIDOCAINE VISCOUS HCL 2 % MT SOLN
OROMUCOSAL | Status: AC
  Administered 2021-03-02: 15 mL via OROMUCOSAL
  Filled 2021-03-02: qty 15

## 2021-03-02 MED ORDER — PROPOFOL 10 MG/ML IV BOLUS
INTRAVENOUS | Status: AC
  Filled 2021-03-02: qty 40

## 2021-03-02 MED ORDER — PROPOFOL 10 MG/ML IV BOLUS
INTRAVENOUS | Status: DC | PRN
  Administered 2021-03-02 (×2): 50 mg via INTRAVENOUS

## 2021-03-02 MED ORDER — LIDOCAINE HCL (CARDIAC) PF 100 MG/5ML IV SOSY
PREFILLED_SYRINGE | INTRAVENOUS | Status: DC | PRN
  Administered 2021-03-02: 50 mg via INTRAVENOUS

## 2021-03-02 MED ORDER — PROPOFOL 500 MG/50ML IV EMUL: INTRAVENOUS | Status: DC | PRN

## 2021-03-02 NOTE — Op Note (Signed)
[facility] Patient Name: Amanda Hutchinson Procedure Date: 03/02/2021 8:28 AM MRN: [id number] Date of Birth: [birth year] Attending MD: Elon Alas. Abbey Chatters DO CSN: [id number] Age: 65 Admit Type: Outpatient Procedure:                Upper GI endoscopy Indications:              Dysphagia, Heartburn Providers:                Elon Alas. Abbey Chatters, DO, Lambert Mody, Aram Candela Referring MD:              Medicines:                See the Anesthesia note for documentation of the                            administered medications Complications:            No immediate complications. Estimated Blood Loss:     Estimated blood loss was minimal. Procedure:                Pre-Anesthesia Assessment:                           - The anesthesia plan was to use monitored                            anesthesia care (MAC).                           After obtaining informed consent, the endoscope was                            passed under direct vision. Throughout the                            procedure, the patient's blood pressure, pulse, and                            oxygen saturations were monitored continuously. The                            [id number]) was introduced through the mouth,                            and advanced to the second part of duodenum. The                            upper GI endoscopy was accomplished without                            difficulty. The patient tolerated the procedure                            well. Scope In: 8:43:16 AM Scope Out: 8:47:23 AM Total  Procedure Duration: 0 hours 4 minutes 7 seconds  Findings:      There is no endoscopic evidence of Barrett's esophagus, bleeding, areas       of erosion, esophagitis, hiatal hernia, ulcerations or varices in the       entire esophagus.      One benign-appearing, intrinsic mild stenosis was found in the lower       third of the esophagus. The stenosis was traversed. A TTS  dilator was       passed through the scope. Dilation with an 18-19-20 mm balloon dilator       was performed to 20 mm. The dilation site was examined and showed       moderate improvement in luminal narrowing.      Diffuse mild inflammation characterized by erythema was found in the       entire examined stomach. Biopsies were taken with a cold forceps for       Helicobacter pylori testing.      The duodenal bulb, first portion of the duodenum and second portion of       the duodenum were normal. Impression:               - Benign-appearing esophageal stenosis. Dilated.                           - Gastritis. Biopsied.                           - Normal duodenal bulb, first portion of the                            duodenum and second portion of the duodenum. Moderate Sedation:      Per Anesthesia Care Recommendation:           - Patient has a contact number available for                            emergencies. The signs and symptoms of potential                            delayed complications were discussed with the                            patient. Return to normal activities tomorrow.                            Written discharge instructions were provided to the                            patient.                           - Resume previous diet.                           - Continue present medications.                           - Await pathology results.                           -  Repeat upper endoscopy PRN for retreatment.                           - Use a proton pump inhibitor PO BID.                           - No ibuprofen, naproxen, or other non-steroidal                            anti-inflammatory drugs.                           - Return to GI clinic in 3 months. Procedure Code(s):        --- Professional ---                           [phone], Esophagogastroduodenoscopy, flexible,                            transoral; with transendoscopic balloon dilation of                             esophagus (less than 30 mm diameter)                           43239, 59, Esophagogastroduodenoscopy, flexible,                            transoral; with biopsy, single or multiple Diagnosis Code(s):        --- Professional ---                           K22.2, Esophageal obstruction                           K29.70, Gastritis, unspecified, without bleeding                           R13.10, Dysphagia, unspecified                           R12, Heartburn CPT copyright 2019 American Medical Association. All rights reserved. The codes documented in this report are preliminary and upon coder review may  be revised to meet current compliance requirements. Elon Alas. Abbey Chatters, DO Taylor Damary Doland, DO 03/02/2021 8:50:10 AM This report has been signed electronically. Number of Addenda: 0

## 2021-03-02 NOTE — Transfer of Care (Signed)
Immediate Anesthesia Transfer of Care Note  Patient: Amanda Hutchinson  Procedure(s) Performed: ESOPHAGOGASTRODUODENOSCOPY (EGD) WITH PROPOFOL (N/A ) BALLOON DILATION (N/A ) BIOPSY  Patient Location: PACU and Short Stay  Anesthesia Type:General  Level of Consciousness: awake, alert  and oriented  Airway & Oxygen Therapy: Patient Spontanous Breathing  Post-op Assessment: Report given to RN, Post -op Vital signs reviewed and stable and Patient moving all extremities X 4  Post vital signs: Reviewed and stable  Last Vitals:  Vitals Value Taken Time  BP    Temp    Pulse    Resp    SpO2      Last Pain:  Vitals:   03/02/21 0842  TempSrc:   PainSc: 0-No pain      Patients Stated Pain Goal: 7 (19/37/90 2409)  Complications: No complications documented.

## 2021-03-02 NOTE — Anesthesia Preprocedure Evaluation (Addendum)
Anesthesia Evaluation  Patient identified by MRN, date of birth, ID band Patient awake    Reviewed: Allergy & Precautions, NPO status , Patient's Chart, lab work & pertinent test results  History of Anesthesia Complications Negative for: history of anesthetic complications  Airway Mallampati: II  TM Distance: >3 FB Neck ROM: Full    Dental  (+) Dental Advisory Given, Missing   Pulmonary sleep apnea and Continuous Positive Airway Pressure Ventilation ,    Pulmonary exam normal breath sounds clear to auscultation       Cardiovascular Exercise Tolerance: Good hypertension, Pt. on medications Normal cardiovascular exam Rhythm:Regular Rate:Normal  01-Mar-2021 08:55:33 [place] [facility]-OPS ROUTINE RECORD Sinus rhythm with marked sinus arrhythmia Possible Anterolateral infarct , age undetermined Abnormal ECG No previous tracing Confirmed by Ena Dawley [id number]) on 03/01/2021 2:21:14 PM   Neuro/Psych negative neurological ROS  negative psych ROS   GI/Hepatic Neg liver ROS, GERD  Medicated and Controlled,  Endo/Other  Morbid obesity  Renal/GU negative Renal ROS  negative genitourinary   Musculoskeletal negative musculoskeletal ROS (+)   Abdominal   Peds  Hematology negative hematology ROS (+)   Anesthesia Other Findings   Reproductive/Obstetrics negative OB ROS                           Anesthesia Physical Anesthesia Plan  ASA: III  Anesthesia Plan: General   Post-op Pain Management:    Induction: Intravenous  PONV Risk Score and Plan: Propofol infusion  Airway Management Planned: Nasal Cannula and Natural Airway  Additional Equipment:   Intra-op Plan:   Post-operative Plan:   Informed Consent: I have reviewed the patients History and Physical, chart, labs and discussed the procedure including the risks, benefits and alternatives for the proposed anesthesia with the  patient or authorized representative who has indicated his/her understanding and acceptance.     Dental advisory given  Plan Discussed with: CRNA and Surgeon  Anesthesia Plan Comments:        Anesthesia Quick Evaluation

## 2021-03-02 NOTE — Discharge Instructions (Addendum)
EGD Discharge instructions Please read the instructions outlined below and refer to this sheet in the next few weeks. These discharge instructions provide you with general information on caring for yourself after you leave the hospital. Your doctor may also give you specific instructions. While your treatment has been planned according to the most current medical practices available, unavoidable complications occasionally occur. If you have any problems or questions after discharge, please call your doctor. ACTIVITY  You may resume your regular activity but move at a slower pace for the next 24 hours.   Take frequent rest periods for the next 24 hours.   Walking will help expel (get rid of) the air and reduce the bloated feeling in your abdomen.   No driving for 24 hours (because of the anesthesia (medicine) used during the test).   You may shower.   Do not sign any important legal documents or operate any machinery for 24 hours (because of the anesthesia used during the test).  NUTRITION  Drink plenty of fluids.   You may resume your normal diet.   Begin with a light meal and progress to your normal diet.   Avoid alcoholic beverages for 24 hours or as instructed by your caregiver.  MEDICATIONS  You may resume your normal medications unless your caregiver tells you otherwise.  WHAT YOU CAN EXPECT TODAY  You may experience abdominal discomfort such as a feeling of fullness or "gas" pains.  FOLLOW-UP  Your doctor will discuss the results of your test with you.  SEEK IMMEDIATE MEDICAL ATTENTION IF ANY OF THE FOLLOWING OCCUR:  Excessive nausea (feeling sick to your stomach) and/or vomiting.   Severe abdominal pain and distention (swelling).   Trouble swallowing.   Temperature over 101 F (37.8 C).   Rectal bleeding or vomiting of blood.   Your EGD revealed a mild amount inflammation in your stomach.  I biopsied this to rule out infection with a bacteria called H. pylori.   You did have a slight narrowing of your esophagus as well and I dilated with a balloon.  Hopefully this helps with your swallowing.  Continue on twice daily pantoprazole.  Avoid NSAIDs as best as you can.  Follow-up with GI in 2 to 3 months.   I hope you have a great rest of your week!  Elon Alas. Abbey Chatters, D.O. Gastroenterology and Hepatology [facility]

## 2021-03-02 NOTE — H&P (Signed)
Primary Care Physician:  Monico Blitz, MD Primary Gastroenterologist:  Dr. Abbey Chatters  Pre-Procedure History & Physical: HPI:  Amanda Hutchinson is a 65 y.o. female is here or an EGD for GERD and dysphagia. She has had issues "getting strangled" almost every meal now. She had this issue "a long time ago" but self-resolved. She has a chronic GERD for years. No regurgitation, food generally passes with time. Sometimes issues even with water. Also occasional pill dysphagia. Has associated odynophagia. Rare abdominal discomfort. She did have a couple episodes of dark stools after drinking black tea and a lot of greens. She is on diclofenac, no OTC NSAIDs/ASA powders.   Past Medical History:  Diagnosis Date  . BMI 40.0-44.9, adult ([place])   . Hypertension   . Sleep apnea     Past Surgical History:  Procedure Laterality Date  . HYSTERECTOMY ABDOMINAL WITH SALPINGECTOMY    . LAPAROSCOPIC CHOLECYSTECTOMY      Prior to Admission medications   Medication Sig Start Date End Date Taking? Authorizing Provider  albuterol (VENTOLIN HFA) 108 (90 Base) MCG/ACT inhaler Inhale 1-2 puffs into the lungs every 6 (six) hours as needed for wheezing or shortness of breath.   Yes [provider]  ammonium lactate (AMLACTIN) 12 % cream Apply topically in the morning and at bedtime.   Yes [provider]  atenolol (TENORMIN) 25 MG tablet Take 25 mg by mouth daily.   Yes [provider]  benzonatate (TESSALON) 100 MG capsule Take by mouth 3 (three) times daily as needed for cough.   Yes [provider]  cetirizine (ZYRTEC) 10 MG tablet Take 10 mg by mouth at bedtime.   Yes [provider]  Cholecalciferol (VITAMIN D) 50 MCG (2000 UT) CAPS Take 2,000 Units by mouth daily.   Yes [provider]  HYDROcodone-acetaminophen (NORCO) 7.5-325 MG tablet Take 1 tablet by mouth in the morning, at noon, and at bedtime.   Yes [provider]  hydroxypropyl methylcellulose /  hypromellose (ISOPTO TEARS / GONIOVISC) 2.5 % ophthalmic solution Place 1 drop into both eyes 3 (three) times daily as needed for dry eyes.   Yes [provider]  Ibuprofen-Famotidine 800-26.6 MG TABS Take 1 tablet by mouth in the morning, at noon, and at bedtime.   Yes [provider]  Milnacipran (SAVELLA) 50 MG TABS tablet Take 50 mg by mouth 2 (two) times daily.   Yes [provider]  pantoprazole (PROTONIX) 40 MG tablet Take 1 tablet (40 mg total) by mouth 2 (two) times daily before a meal. 12/10/20  Yes Walden Field A, NP  amLODipine (NORVASC) 5 MG tablet Take 5 mg by mouth daily.    [provider]  Azelastine HCl (ASTELIN NA) Place into the nose. Patient not taking: No sig reported    [provider]  clobetasol cream (TEMOVATE) 4.09 % Apply 1 application topically 2 (two) times daily. Patient not taking: No sig reported    [provider]  conjugated estrogens (PREMARIN) vaginal cream Place 1 Applicatorful vaginally daily. Patient not taking: No sig reported    [provider]  Diclofenac Sodium (DICLOFONO) 1.6 % GEL Apply topically. Patient not taking: No sig reported    [provider]  docusate sodium (COLACE) 100 MG capsule Take 100 mg by mouth daily. Patient not taking: No sig reported    [provider]  estradiol (ESTRACE) 0.5 MG tablet Take 0.5 mg by mouth every other day. Patient not taking: No sig reported  [provider]  fluticasone (FLONASE) 50 MCG/ACT nasal spray Place 1 spray into both nostrils daily. Patient not taking: No sig reported    [provider]  gabapentin (NEURONTIN) 100 MG capsule Take 100 mg by mouth at bedtime. Patient not taking: No sig reported    [provider]  gabapentin (NEURONTIN) 800 MG tablet Take 800 mg by mouth 3 (three) times daily. Patient not taking: No sig reported    [provider]  levocetirizine (XYZAL) 5 MG tablet Take  5 mg by mouth every evening. Patient not taking: No sig reported    [provider]  neomycin-bacitracin-polymyxin (NEOSPORIN) ointment Apply 1 application topically every 12 (twelve) hours. Patient not taking: No sig reported    [provider]    Allergies as of 01/26/2021 - Review Complete 01/26/2021  Allergen Reaction Noted  . Ace inhibitors  03/13/2019  . Chlorthalidone  03/14/2019  . Diphenhydramine hcl  03/13/2019  . Sulfamethoxazole-trimethoprim  03/13/2019  . Tramadol  03/13/2019  . Oxycodone Rash and Other (See Comments) 03/13/2019    Family History  Problem Relation Age of Onset  . Colon cancer Neg Hx   . Liver disease Neg Hx     Social History   Socioeconomic History  . Marital status: Single    Spouse name: Not on file  . Number of children: Not on file  . Years of education: Not on file  . Highest education level: Not on file  Occupational History  . Not on file  Tobacco Use  . Smoking status: Never Smoker  . Smokeless tobacco: Never Used  Substance and Sexual Activity  . Alcohol use: Never  . Drug use: Never  . Sexual activity: Not on file  Other Topics Concern  . Not on file  Social History Narrative  . Not on file   Social Determinants of Health   Financial Resource Strain: Not on file  Food Insecurity: Not on file  Transportation Needs: Not on file  Physical Activity: Not on file  Stress: Not on file  Social Connections: Not on file  Intimate Partner Violence: Not on file    Review of Systems: See HPI, otherwise negative ROS  Physical Exam: Vital signs in last 24 hours: Temp:  [97.9 F (36.6 C)-98.3 F (36.8 C)] 97.9 F (36.6 C) (03/08 0745) Pulse Rate:  [58-67] 58 (03/08 0745) Resp:  [16-19] 19 (03/08 0745) BP: (147-151)/(73-76) 151/73 (03/08 0745) SpO2:  [100 %] 100 % (03/08 0745) Weight:  [108.9 kg] 108.9 kg (03/07 0846)   General:   Alert,  Well-developed, well-nourished, pleasant and cooperative in  NAD Head:  Normocephalic and atraumatic. Eyes:  Sclera clear, no icterus.   Conjunctiva pink. Ears:  Normal auditory acuity. Nose:  No deformity, discharge,  or lesions. Mouth:  No deformity or lesions, dentition normal. Neck:  Supple; no masses or thyromegaly. Lungs:  Clear throughout to auscultation.   No wheezes, crackles, or rhonchi. No acute distress. Heart:  Regular rate and rhythm; no murmurs, clicks, rubs,  or gallops. Abdomen:  Soft, nontender and nondistended. No masses, hepatosplenomegaly or hernias noted. Normal bowel sounds, without guarding, and without rebound.   Msk:  Symmetrical without gross deformities. Normal posture. Extremities:  Without clubbing or edema. Neurologic:  Alert and  oriented x4;  grossly normal neurologically. Skin:  Intact without significant lesions or rashes. Cervical Nodes:  No significant cervical adenopathy. Psych:  Alert and cooperative. Normal mood and affect.  Impression/Plan: Amanda Hutchinson is here for  an EGD for GERD and dysphagia   The risks of the procedure including infection, bleed, or perforation as well as benefits, limitations, alternatives and imponderables have been reviewed with the patient. Questions have been answered. All parties agreeable.

## 2021-03-02 NOTE — Anesthesia Postprocedure Evaluation (Signed)
Anesthesia Post Note  Patient: Amanda Hutchinson  Procedure(s) Performed: ESOPHAGOGASTRODUODENOSCOPY (EGD) WITH PROPOFOL (N/A ) BALLOON DILATION (N/A ) BIOPSY  Patient location during evaluation: Endoscopy Anesthesia Type: General Level of consciousness: awake and alert Pain management: pain level controlled Vital Signs Assessment: post-procedure vital signs reviewed and stable Respiratory status: spontaneous breathing, nonlabored ventilation, respiratory function stable and patient connected to nasal cannula oxygen Cardiovascular status: blood pressure returned to baseline and stable Postop Assessment: no apparent nausea or vomiting Anesthetic complications: no   No complications documented.   Last Vitals:  Vitals:   03/02/21 0745  BP: (!) 151/73  Pulse: (!) 58  Resp: 19  Temp: 36.6 C  SpO2: 100%    Last Pain:  Vitals:   03/02/21 0842  TempSrc:   PainSc: 0-No pain                 Karna Dupes

## 2021-03-03 LAB — SURGICAL PATHOLOGY

## 2021-03-05 ENCOUNTER — Encounter (HOSPITAL_COMMUNITY): Payer: Self-pay | Admitting: Internal Medicine

## 2021-04-28 ENCOUNTER — Encounter: Payer: Self-pay | Admitting: Nurse Practitioner

## 2021-04-28 ENCOUNTER — Ambulatory Visit: Payer: Medicare HMO | Admitting: Nurse Practitioner

## 2021-04-28 ENCOUNTER — Other Ambulatory Visit: Payer: Self-pay

## 2021-04-28 VITALS — BP 165/81 | HR 78 | Temp 97.3°F | Ht 63.0 in | Wt 245.6 lb

## 2021-04-28 DIAGNOSIS — R195 Other fecal abnormalities: Secondary | ICD-10-CM

## 2021-04-28 DIAGNOSIS — K649 Unspecified hemorrhoids: Secondary | ICD-10-CM | POA: Diagnosis not present

## 2021-04-28 DIAGNOSIS — K219 Gastro-esophageal reflux disease without esophagitis: Secondary | ICD-10-CM

## 2021-04-28 DIAGNOSIS — R1319 Other dysphagia: Secondary | ICD-10-CM | POA: Diagnosis not present

## 2021-04-28 MED ORDER — HYDROCORTISONE (PERIANAL) 2.5 % EX CREA: 1.0000 "application " | TOPICAL_CREAM | Freq: Two times a day (BID) | CUTANEOUS | 1 refills | Status: DC

## 2021-04-28 NOTE — Patient Instructions (Signed)
Your health issues we discussed today were:   GERD (heartburn/reflux) with previous dark stools and dysphagia (swallowing difficulties): 1. Anxiety doing better! 2. Continue taking Protonix (pantoprazole) 40 mg twice daily 3. Notify us of any worsening or severe symptoms 4. Let us know if you have any more or worse swallowing difficulties  Hemorrhoids: 1. I sent in a prescription for Anusol rectal cream 2. You can apply this up to twice a day for up to 10 days at a time for hemorrhoids 3. Notify us if you have any worsening symptoms or if you see any bleeding from the hemorrhoids  Overall I recommend:  1. Continue other current medications 2. Return for follow-up in 6 months  3. Call us If You Have Any questions or concerns   ---------------------------------------------------------------  I am glad you have gotten your COVID-19 vaccination!  Even though you are fully vaccinated you should continue to follow CDC and state/local guidelines.  ---------------------------------------------------------------   At [facility] we value your feedback. You may receive a survey about your visit today. Please share your experience as we strive to create trusting relationships with our patients to provide genuine, compassionate, quality care.  We appreciate your understanding and patience as we review any laboratory studies, imaging, and other diagnostic tests that are ordered as we care for you. Our office policy is 5 business days for review of these results, and any emergent or urgent results are addressed in a timely manner for your best interest. If you do not hear from our office in 1 week, please contact us.   We also encourage the use of MyChart, which contains your medical information for your review as well. If you are not enrolled in this feature, an access code is on this after visit summary for your convenience. Thank you for allowing [place] to be involved in your  care.  It was great to see you today!  I hope you have a great summer!!

## 2021-04-28 NOTE — Progress Notes (Signed)
Referring Provider: Monico Blitz, MD Primary Care Physician:  Monico Blitz, MD Primary GI:  Dr. Abbey Chatters  Chief Complaint  Patient presents with  . Gastroesophageal Reflux    HPI:   Amanda Hutchinson is a 65 y.o. female who presents to follow-up on GERD and dark stools.  Patient was last seen in our office 01/26/2021 for the same as well as constipation and dysphagia.  At her last visit noted GERD doing well on Protonix 40 mg twice daily, notes solid food dysphagia previously that self resolved but is now recurrent.  Chronic GERD for years.  No regurgitation.  Couple episodes of dark stools after drinking black tea and a lot of greens.  Was noted to be on diclofenac at that time but no other NSAIDs.  No other overt GI complaints.  Recommended EGD, dysphagia 3 diet in the interim, continue current medications, follow-up in 3 months.    EGD was completed 03/02/2021 which found benign-appearing esophageal stenosis status post dilation, gastritis status post biopsy, normal duodenum.  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  Recommended PPI twice daily, avoid all NSAIDs, follow-up in 3 months.  Today she states she is doing okay overall. GERD is doing a lot better, still on Protonix bid which she feels is effective. Denies any further dark stools. Rare dysphagia symptoms, but much better than it was. Denies abdominal pain, N/V, hematochezia, fever, chills, unintentional weight loss. Denies URI or flu-like symptoms. Denies loss of sense of taste or smell. The patient has received COVID-19 vaccination(s). Denies chest pain, dyspnea, dizziness, lightheadedness, syncope, near syncope. Denies any other upper or lower GI symptoms.  At the end of her visit she notes bothersome hemorrhoids without bleeding. Hasn't tried anything for these. Is asking for Rx hemorrhoid cream.  Past Medical History:  Diagnosis Date  . BMI 40.0-44.9, adult ([place])   . Hypertension    . Sleep apnea     Past Surgical History:  Procedure Laterality Date  . BALLOON DILATION N/A 03/02/2021   Procedure: BALLOON DILATION;  Surgeon: Eloise Harman, DO;  [facility];  Service: Endoscopy;  Laterality: N/A;  . BIOPSY  03/02/2021   Procedure: BIOPSY;  Surgeon: Eloise Harman, DO;  [facility];  Service: Endoscopy;;  . ESOPHAGOGASTRODUODENOSCOPY (EGD) WITH PROPOFOL N/A 03/02/2021   Procedure: ESOPHAGOGASTRODUODENOSCOPY (EGD) WITH PROPOFOL;  Surgeon: Eloise Harman, DO;  [facility];  Service: Endoscopy;  Laterality: N/A;  am appt  . HYSTERECTOMY ABDOMINAL WITH SALPINGECTOMY    . LAPAROSCOPIC CHOLECYSTECTOMY      Current Outpatient Medications  Medication Sig Dispense Refill  . albuterol (VENTOLIN HFA) 108 (90 Base) MCG/ACT inhaler Inhale 1-2 puffs into the lungs every 6 (six) hours as needed for wheezing or shortness of breath.    Marland Kitchen amLODipine (NORVASC) 5 MG tablet Take 5 mg by mouth daily.    Marland Kitchen ammonium lactate (AMLACTIN) 12 % cream Apply topically in the morning and at bedtime.    Marland Kitchen atenolol (TENORMIN) 25 MG tablet Take 25 mg by mouth daily.    . benzonatate (TESSALON) 100 MG capsule Take by mouth 3 (three) times daily as needed for cough.    . cetirizine (ZYRTEC) 10 MG tablet Take 10 mg by mouth at bedtime.    . Cholecalciferol (VITAMIN D) 50 MCG (2000 UT) CAPS Take 2,000 Units by mouth daily.    Marland Kitchen estradiol (ESTRACE) 0.5 MG tablet Take 0.5 mg by mouth  every other day.    Marland Kitchen HYDROcodone-acetaminophen (NORCO) 7.5-325 MG tablet Take 1 tablet by mouth in the morning, at noon, and at bedtime.    . hydrocortisone (ANUSOL-HC) 2.5 % rectal cream Place 1 application rectally 2 (two) times daily. For up to 10 days at a time 30 g 1  . hydroxypropyl methylcellulose / hypromellose (ISOPTO TEARS / GONIOVISC) 2.5 % ophthalmic solution Place 1 drop into both eyes 3 (three) times daily as needed for dry eyes.    . Ibuprofen-Famotidine 800-26.6 MG TABS  Take 1 tablet by mouth in the morning, at noon, and at bedtime.    . Milnacipran (SAVELLA) 50 MG TABS tablet Take 50 mg by mouth 2 (two) times daily.    . pantoprazole (PROTONIX) 40 MG tablet Take 1 tablet (40 mg total) by mouth 2 (two) times daily before a meal. 60 tablet 5   No current facility-administered medications for this visit.    Allergies as of 04/28/2021 - Review Complete 04/28/2021  Allergen Reaction Noted  . Ace inhibitors  03/13/2019  . Chlorthalidone  03/14/2019  . Sulfamethoxazole-trimethoprim  03/13/2019  . Tramadol  03/13/2019  . Diphenhydramine hcl Rash 03/13/2019  . Oxycodone Itching and Rash 03/13/2019    Family History  Problem Relation Age of Onset  . Colon cancer Neg Hx   . Liver disease Neg Hx     Social History   Socioeconomic History  . Marital status: Single    Spouse name: Not on file  . Number of children: Not on file  . Years of education: Not on file  . Highest education level: Not on file  Occupational History  . Not on file  Tobacco Use  . Smoking status: Never Smoker  . Smokeless tobacco: Never Used  Substance and Sexual Activity  . Alcohol use: Never  . Drug use: Never  . Sexual activity: Not on file  Other Topics Concern  . Not on file  Social History Narrative  . Not on file   Social Determinants of Health   Financial Resource Strain: Not on file  Food Insecurity: Not on file  Transportation Needs: Not on file  Physical Activity: Not on file  Stress: Not on file  Social Connections: Not on file    Subjective: Review of Systems  Constitutional: Negative for chills, fever, malaise/fatigue and weight loss.  HENT: Negative for congestion and sore throat.   Respiratory: Negative for cough and shortness of breath.   Cardiovascular: Negative for chest pain and palpitations.  Gastrointestinal: Negative for abdominal pain, blood in stool, diarrhea, heartburn, melena, nausea and vomiting.       Rare dysphagia   Musculoskeletal: Negative for joint pain and myalgias.  Skin: Negative for rash.  Neurological: Negative for dizziness and weakness.  Endo/Heme/Allergies: Does not bruise/bleed easily.  Psychiatric/Behavioral: Negative for depression. The patient is not nervous/anxious.   All other systems reviewed and are negative.    Objective: BP (!) 165/81   Pulse 78   Temp (!) 97.3 F (36.3 C) (Temporal)   Ht 5\' 3"  (1.6 m)   Wt 245 lb 9.6 oz (111.4 kg)   BMI 43.51 kg/m  Physical Exam Vitals and nursing note reviewed.  Constitutional:      General: She is not in acute distress.    Appearance: Normal appearance. She is well-developed. She is obese. She is not ill-appearing, toxic-appearing or diaphoretic.  HENT:     Head: Normocephalic and atraumatic.     Nose: No congestion or rhinorrhea.  Eyes:     General: No scleral icterus. Cardiovascular:     Rate and Rhythm: Normal rate and regular rhythm.     Heart sounds: Normal heart sounds.  Pulmonary:     Effort: Pulmonary effort is normal. No respiratory distress.     Breath sounds: Normal breath sounds.  Abdominal:     General: Bowel sounds are normal.     Palpations: Abdomen is soft. There is no hepatomegaly, splenomegaly or mass.     Tenderness: There is no abdominal tenderness. There is no guarding or rebound.     Hernia: No hernia is present.  Skin:    General: Skin is warm and dry.     Coloration: Skin is not jaundiced.     Findings: No rash.  Neurological:     General: No focal deficit present.     Mental Status: She is alert and oriented to person, place, and time.  Psychiatric:        Attention and Perception: Attention normal.        Mood and Affect: Mood normal.        Speech: Speech normal.        Behavior: Behavior normal.        Thought Content: Thought content normal.        Cognition and Memory: Cognition and memory normal.      Assessment:  Pleasant 65 year old female presents to follow-up on GERD, dark  stools.  Also noted hemorrhoids during our discussion.  No red flag/warning signs or symptoms.  GERD with dark stools: Symptoms doing better on current medication regimen including pantoprazole 40 mg twice daily.  No further dark stools.  Recommend she continue her current medications.  Follow-up in 6 months.  Dysphagia: Rare recurrent symptoms, query related to dietary indiscretions.  Overall doing much better compared to prior to her EGD.  Recommend she continue to avoid foods that tend to cause an issue.  Follow-up in 6 months.  Hemorrhoids: Denies rectal bleeding with her hemorrhoids.  Typically has some irritation and rectal burning.  Is requesting a prescription hemorrhoid cream.  I will send in Anusol that she can use as needed.  Notify us if worsening symptoms or rectal bleeding   Plan: 1. Continue current medications 2. Start Anusol rectal cream twice daily as needed for up to 10 days at a time 3. Follow-up in 6 months    Thank you for allowing [place] to participate in the care of Ross, DNP, AGNP-C Adult & Gerontological Nurse Practitioner [facility]   04/28/2021 10:58 AM   Disclaimer: This note was dictated with voice recognition software. Similar sounding words can inadvertently be transcribed and may not be corrected upon review.

## 2021-05-31 ENCOUNTER — Ambulatory Visit
Admission: RE | Admit: 2021-05-31 | Discharge: 2021-05-31 | Disposition: A | Discharge status: Home / Self Care | Payer: Medicare HMO | Source: Ambulatory Visit | Attending: Internal Medicine | Admitting: Internal Medicine

## 2021-05-31 ENCOUNTER — Other Ambulatory Visit: Payer: Self-pay | Admitting: Internal Medicine

## 2021-05-31 ENCOUNTER — Other Ambulatory Visit: Payer: Self-pay

## 2021-05-31 DIAGNOSIS — Z1231 Encounter for screening mammogram for malignant neoplasm of breast: Secondary | ICD-10-CM

## 2021-11-01 ENCOUNTER — Encounter: Payer: Self-pay | Admitting: Gastroenterology

## 2021-11-01 NOTE — Progress Notes (Addendum)
Referring Provider: Monico Blitz, MD Primary Care Physician:  Monico Blitz, MD Primary GI: Dr. Abbey Chatters   Chief Complaint  Patient presents with   Gastroesophageal Reflux    Doing okay   Hemorrhoids    Wants cream to have on hand. No issues right now    HPI:   Amanda Hutchinson is a 65 y.o. female presenting today with a history of GERD, dysphagia s/p dilation in March 2022, returning for follow-up. Last seen in May 2022. Last colonoscopy in 2014 with diverticulosis.    Takes a stool softener daily. Sometimes with fecal smearing. Lots of wiping. No rectal bleeding. Pantoprazole once to twice a day. Would like Anusol cream refilled.   Concern for cirrhosis in setting of fatty liver on elastography but normal kPa score. No thrombocytopenia. EGD WITHOUT portal gastropathy.     Past Medical History:  Diagnosis Date   BMI 40.0-44.9, adult ([place])    Hypertension    Sleep apnea     Past Surgical History:  Procedure Laterality Date   BALLOON DILATION N/A 03/02/2021   Procedure: BALLOON DILATION;  Surgeon: Eloise Harman, DO;  [facility];  Service: Endoscopy;  Laterality: N/A;   BIOPSY  03/02/2021   Procedure: BIOPSY;  Surgeon: Eloise Harman, DO;  [facility];  Service: Endoscopy;;   COLONOSCOPY  05/2013   external hemorrhoids, sigmoid diverticulosis   ESOPHAGOGASTRODUODENOSCOPY (EGD) WITH PROPOFOL N/A 03/02/2021   benign-appearing esophageal stenosis status post dilation, gastritis status post biopsy, normal duodenum.  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   HYSTERECTOMY ABDOMINAL WITH SALPINGECTOMY     LAPAROSCOPIC CHOLECYSTECTOMY      Current Outpatient Medications  Medication Sig Dispense Refill   albuterol (VENTOLIN HFA) 108 (90 Base) MCG/ACT inhaler Inhale 1-2 puffs into the lungs every 6 (six) hours as needed for wheezing or shortness of breath.     amLODipine (NORVASC) 5 MG tablet  Take 5 mg by mouth daily.     ammonium lactate (AMLACTIN) 12 % cream Apply topically in the morning and at bedtime.     atenolol (TENORMIN) 25 MG tablet Take 25 mg by mouth daily.     cetirizine (ZYRTEC) 10 MG tablet Take 10 mg by mouth at bedtime.     Cholecalciferol (VITAMIN D) 50 MCG (2000 UT) CAPS Take 2,000 Units by mouth daily.     estradiol (ESTRACE) 0.5 MG tablet Take 0.5 mg by mouth every other day.     gabapentin (NEURONTIN) 100 MG capsule Take 100 mg by mouth 3 (three) times daily.     HYDROcodone-acetaminophen (NORCO) 7.5-325 MG tablet Take 1 tablet by mouth in the morning, at noon, and at bedtime.     Milnacipran (SAVELLA) 50 MG TABS tablet Take 50 mg by mouth 2 (two) times daily.     pantoprazole (PROTONIX) 40 MG tablet Take 1 tablet (40 mg total) by mouth 2 (two) times daily before a meal. 60 tablet 5   benzonatate (TESSALON) 100 MG capsule Take by mouth 3 (three) times daily as needed for cough. (Patient not taking: Reported on 11/02/2021)     hydrocortisone (ANUSOL-HC) 2.5 % rectal cream Place 1 application rectally 2 (two) times daily. For up to 10 days at a time (Patient not taking: Reported on 11/02/2021) 30 g 1   hydroxypropyl methylcellulose / hypromellose (ISOPTO TEARS / GONIOVISC) 2.5 % ophthalmic solution Place 1 drop into both eyes 3 (three) times daily  as needed for dry eyes. (Patient not taking: Reported on 11/02/2021)     Ibuprofen-Famotidine 800-26.6 MG TABS Take 1 tablet by mouth in the morning, at noon, and at bedtime. (Patient not taking: Reported on 11/02/2021)     No current facility-administered medications for this visit.    Allergies as of 11/02/2021 - Review Complete 11/02/2021  Allergen Reaction Noted   Ace inhibitors  03/13/2019   Chlorthalidone  03/14/2019   Sulfamethoxazole-trimethoprim  03/13/2019   Tramadol  03/13/2019   Diphenhydramine hcl Rash 03/13/2019   Oxycodone Itching and Rash 03/13/2019    Family History  Problem Relation Age of Onset    Colon cancer Neg Hx    Liver disease Neg Hx     Social History   Socioeconomic History   Marital status: Single    Spouse name: Not on file   Number of children: Not on file   Years of education: Not on file   Highest education level: Not on file  Occupational History   Not on file  Tobacco Use   Smoking status: Never   Smokeless tobacco: Never  Substance and Sexual Activity   Alcohol use: Never   Drug use: Never   Sexual activity: Not on file  Other Topics Concern   Not on file  Social History Narrative   Not on file   Social Determinants of Health   Financial Resource Strain: Not on file  Food Insecurity: Not on file  Transportation Needs: Not on file  Physical Activity: Not on file  Stress: Not on file  Social Connections: Not on file    Review of Systems: Gen: Denies fever, chills, anorexia. Denies fatigue, weakness, weight loss.  CV: Denies chest pain, palpitations, syncope, peripheral edema, and claudication. Resp: Denies dyspnea at rest, cough, wheezing, coughing up blood, and pleurisy. GI: see HPI Derm: Denies rash, itching, dry skin Psych: Denies depression, anxiety, memory loss, confusion. No homicidal or suicidal ideation.  Heme: Denies bruising, bleeding, and enlarged lymph nodes.  Physical Exam: BP (!) 145/87   Pulse 82   Temp (!) 97 F (36.1 C)   Ht 5\' 3"  (1.6 m)   Wt 244 lb 12.8 oz (111 kg)   BMI 43.36 kg/m  General:   Alert and oriented. No distress noted. Pleasant and cooperative.  Head:  Normocephalic and atraumatic. Eyes:  Conjuctiva clear without scleral icterus. Mouth:  Oral mucosa pink and moist. Good dentition. No lesions. Rectal: fecal smearing present. Small Grade 2 prolapsed hemorrhoid easily reduced. Poor sphincter tone. No mass. No overt blood.  Msk:  Symmetrical without gross deformities. Normal posture. Extremities:  Without edema. Neurologic:  Alert and  oriented x4 Psych:  Alert and cooperative. Normal mood and  affect.  ASSESSMENT: Amanda Hutchinson is a 65 y.o. female presenting today with history of GERD, dysphagia s/p dilation earlier this year, constipation, and concern for possibly early cirrhosis in setting of fatty liver. Now with reported history of CHRONIC fecal smearing.   GERD: continue PPI once to twice a day.   Constipation: taking stool softener. Recommend fiber especially in light of fecal smearing.   Fecal smearing: multifactorial in setting of poor sphincter tone, internal hemorrhoids likely contributing. No mass on DRE. Add fiber. Consider hemorrhoid banding if possible but need to rule out cirrhosis first.  Fatty liver: elastography with concern for cirrhosis but normal kPa. I am ordered fibrosure. I have also ordered updated US abdomen complete and labs. May just be dealing with fatty liver. If so,  we can band without any issues. NO portal gastropathy on EGD. No thrombocytopenia.    PLAN:  Add fiber HFP, CBC, fibrosure, US abdomen complete Return in 3-4 months Hemorrhoid banding candidacy remains to be determined   Annitta Needs, PhD, ANP-BC [facility]

## 2021-11-02 ENCOUNTER — Ambulatory Visit: Payer: Medicare HMO | Admitting: Gastroenterology

## 2021-11-02 ENCOUNTER — Other Ambulatory Visit: Payer: Self-pay

## 2021-11-02 ENCOUNTER — Encounter: Payer: Self-pay | Admitting: Gastroenterology

## 2021-11-02 VITALS — BP 145/87 | HR 82 | Temp 97.0°F | Ht 63.0 in | Wt 244.8 lb

## 2021-11-02 DIAGNOSIS — K760 Fatty (change of) liver, not elsewhere classified: Secondary | ICD-10-CM | POA: Diagnosis not present

## 2021-11-02 MED ORDER — HYDROCORTISONE (PERIANAL) 2.5 % EX CREA: 1.0000 "application " | TOPICAL_CREAM | Freq: Two times a day (BID) | CUTANEOUS | 1 refills | Status: DC

## 2021-11-02 NOTE — Patient Instructions (Signed)
I have ordered labs to be completed at [place]. I am checking your liver and the "stiffness" of your liver.  I have ordered an ultrasound as well.   I need to make sure you have just a fatty liver before I do any hemorrhoid bandings.   I recommend adding fiber (Benefiber or Metamucil) daily. This can help with the formation of stools and help decrease incontinence and smearing.   I sent in the cream to your pharmacy!  We will see you a few months to reassess your stool habits!  It was a pleasure to see you today. I want to create trusting relationships with patients to provide genuine, compassionate, and quality care. I value your feedback. If you receive a survey regarding your visit,  I greatly appreciate you taking time to fill this out.   Annitta Needs, PhD, ANP-BC [facility]

## 2021-11-04 LAB — NASH FIBROSURE
ALPHA 2-MACROGLOBULINS, QN: 118 mg/dL (ref 110–276)
ALT (SGPT) P5P: 24 IU/L (ref 0–40)
AST (SGOT) P5P: 22 IU/L (ref 0–40)
Apolipoprotein A-1: 186 mg/dL (ref 116–209)
Bilirubin, Total: 0.2 mg/dL (ref 0.0–1.2)
Cholesterol, Total: 225 mg/dL — ABNORMAL HIGH (ref 100–199)
Fibrosis Score: 0.03 (ref 0.00–0.21)
GGT: 61 IU/L — ABNORMAL HIGH (ref 0–60)
Glucose: 88 mg/dL (ref 70–99)
Haptoglobin: 264 mg/dL (ref 37–355)
Height: 62 in
NASH Score: 0.25
Steatosis Score: 0.66 — ABNORMAL HIGH (ref 0.00–0.30)
Triglycerides: 93 mg/dL (ref 0–149)
Weight: 200 [lb_av]

## 2021-11-04 LAB — CBC WITH DIFFERENTIAL/PLATELET
Basophils Absolute: 0 10*3/uL (ref 0.0–0.2)
Basos: 1 %
EOS (ABSOLUTE): 0.2 10*3/uL (ref 0.0–0.4)
Eos: 2 %
Hematocrit: 39 % (ref 34.0–46.6)
Hemoglobin: 12.5 g/dL (ref 11.1–15.9)
Immature Grans (Abs): 0 10*3/uL (ref 0.0–0.1)
Immature Granulocytes: 0 %
Lymphocytes Absolute: 2.5 10*3/uL (ref 0.7–3.1)
Lymphs: 38 %
MCH: 28.9 pg (ref 26.6–33.0)
MCHC: 32.1 g/dL (ref 31.5–35.7)
MCV: 90 fL (ref 79–97)
Monocytes Absolute: 0.4 10*3/uL (ref 0.1–0.9)
Monocytes: 7 %
Neutrophils Absolute: 3.4 10*3/uL (ref 1.4–7.0)
Neutrophils: 52 %
Platelets: 402 10*3/uL (ref 150–450)
RBC: 4.33 x10E6/uL (ref 3.77–5.28)
RDW: 13 % (ref 11.7–15.4)
WBC: 6.5 10*3/uL (ref 3.4–10.8)

## 2021-11-04 LAB — HEPATIC FUNCTION PANEL
ALT: 20 IU/L (ref 0–32)
AST: 20 IU/L (ref 0–40)
Albumin: 4.4 g/dL (ref 3.8–4.8)
Alkaline Phosphatase: 120 IU/L (ref 44–121)
Bilirubin Total: 0.3 mg/dL (ref 0.0–1.2)
Bilirubin, Direct: <0.1 mg/dL (ref 0.00–0.40)
Total Protein: 6.9 g/dL (ref 6.0–8.5)

## 2021-11-10 ENCOUNTER — Ambulatory Visit (HOSPITAL_COMMUNITY)
Admission: RE | Admit: 2021-11-10 | Discharge: 2021-11-10 | Disposition: A | Discharge status: Home / Self Care | Payer: Medicare HMO | Source: Ambulatory Visit | Attending: Gastroenterology | Admitting: Gastroenterology

## 2021-11-10 ENCOUNTER — Other Ambulatory Visit: Payer: Self-pay

## 2021-11-10 DIAGNOSIS — K760 Fatty (change of) liver, not elsewhere classified: Secondary | ICD-10-CM | POA: Insufficient documentation

## 2021-11-16 ENCOUNTER — Other Ambulatory Visit: Payer: Self-pay | Admitting: Gastroenterology

## 2021-11-16 MED ORDER — LINACLOTIDE 145 MCG PO CAPS: 145.0000 ug | ORAL_CAPSULE | Freq: Every day | ORAL | 3 refills | Status: DC

## 2021-11-22 ENCOUNTER — Other Ambulatory Visit: Payer: Self-pay | Admitting: Gastroenterology

## 2021-11-22 MED ORDER — LINACLOTIDE 72 MCG PO CAPS: 72.0000 ug | ORAL_CAPSULE | Freq: Every day | ORAL | 3 refills | Status: DC

## 2021-11-22 NOTE — Progress Notes (Signed)
Linzess 72 mcg sent to pharmacy.  

## 2021-11-24 ENCOUNTER — Other Ambulatory Visit: Payer: Self-pay | Admitting: Gastroenterology

## 2021-11-24 MED ORDER — LINACLOTIDE 290 MCG PO CAPS: 290.0000 ug | ORAL_CAPSULE | Freq: Every day | ORAL | 3 refills | Status: DC

## 2021-12-15 ENCOUNTER — Other Ambulatory Visit: Payer: Self-pay

## 2021-12-15 ENCOUNTER — Ambulatory Visit: Payer: Medicare HMO | Admitting: Gastroenterology

## 2021-12-15 ENCOUNTER — Encounter: Payer: Self-pay | Admitting: Gastroenterology

## 2021-12-15 VITALS — BP 145/81 | HR 90 | Temp 97.5°F | Ht 63.0 in | Wt 247.6 lb

## 2021-12-15 DIAGNOSIS — K641 Second degree hemorrhoids: Secondary | ICD-10-CM | POA: Diagnosis not present

## 2021-12-15 NOTE — Progress Notes (Signed)
[place] Banding Note:   Amanda Hutchinson is a 65 y.o. female presenting today for consideration of hemorrhoid banding. Last colonoscopy in 2014 with external hemorrhoids and sigmoid diverticulosis. She has pressure, itching, burning, leaking/soiling. No rectal bleeding.    The patient presents with symptomatic grade 2 hemorrhoids, unresponsive to maximal medical therapy, requesting rubber band ligation of her hemorrhoidal disease. All risks, benefits, and alternative forms of therapy were described and informed consent was obtained.  In the left lateral decubitus position, anoscopic examination revealed grade 2 hemorrhoids in the left lateral and right anterior position (s).  The decision was made to band the left lateral internal hemorrhoid, and the [place] was used to perform band ligation without complication. Digital anorectal examination was then performed to assure proper positioning of the band, and to adjust the banded tissue as required. The patient was discharged home without pain or other issues. Dietary and behavioral recommendations were given and (if necessary prescriptions were given), along with follow-up instructions. The patient will return in  followup and possible additional banding as required.  No complications were encountered and the patient tolerated the procedure well.   Annitta Needs, PhD, ANP-BC [facility]

## 2021-12-15 NOTE — Patient Instructions (Signed)
We will see you in follow-up for further banding!  Continue on Linzess as you are doing. Avoid prolonged toilet time. Limit toilet time to 2-3 minutes, and avoid straining.  Have a great holiday season!  I enjoyed seeing you again today! As you know, I value our relationship and want to provide genuine, compassionate, and quality care. I welcome your feedback. If you receive a survey regarding your visit,  I greatly appreciate you taking time to fill this out. See you next time!  Annitta Needs, PhD, ANP-BC [facility]

## 2021-12-23 ENCOUNTER — Other Ambulatory Visit: Payer: Self-pay | Admitting: Nurse Practitioner

## 2021-12-23 DIAGNOSIS — K219 Gastro-esophageal reflux disease without esophagitis: Secondary | ICD-10-CM

## 2022-01-30 ENCOUNTER — Other Ambulatory Visit: Payer: Self-pay | Admitting: Nurse Practitioner

## 2022-01-30 DIAGNOSIS — K219 Gastro-esophageal reflux disease without esophagitis: Secondary | ICD-10-CM

## 2022-02-10 ENCOUNTER — Encounter: Payer: Self-pay | Admitting: Gastroenterology

## 2022-02-10 ENCOUNTER — Ambulatory Visit: Payer: Medicare PPO | Admitting: Gastroenterology

## 2022-02-10 ENCOUNTER — Other Ambulatory Visit: Payer: Self-pay

## 2022-02-10 VITALS — BP 148/82 | HR 70 | Temp 97.3°F | Ht 63.0 in | Wt 246.4 lb

## 2022-02-10 DIAGNOSIS — K641 Second degree hemorrhoids: Secondary | ICD-10-CM

## 2022-02-10 NOTE — Patient Instructions (Signed)
We will see you in a few weeks!   Please let me know if you have persistent discomfort.  You can use over-the-counter lidocaine ointment as needed.  I enjoyed seeing you again today! As you know, I value our relationship and want to provide genuine, compassionate, and quality care. I welcome your feedback. If you receive a survey regarding your visit,  I greatly appreciate you taking time to fill this out. See you next time!  Annitta Needs, PhD, ANP-BC [facility]

## 2022-02-10 NOTE — Progress Notes (Signed)
[place] Banding Note:   Amanda Hutchinson is a 66 y.o. female presenting today for consideration of hemorrhoid banding. Last colonoscopy  in 2014 with external hemorrhoids and sigmoid diverticulosis. She has pressure, itching, burning, leaking/soiling. No rectal bleeding. She has had left lateral banding. She has noted improvement in symptoms.    The patient presents with symptomatic grade 2 hemorrhoids, unresponsive to maximal medical therapy, requesting rubber band ligation of her hemorrhoidal disease. All risks, benefits, and alternative forms of therapy were described and informed consent was obtained.  The decision was made to band the right posterior internal hemorrhoid, and the [place] was used to perform band ligation without complication. Digital anorectal examination was then performed to assure proper positioning of the band, and to adjust the banded tissue as required. Slight discomfort noted after release of ligator. I manipulated the band with relief. The patient was discharged home without pain or other issues. Dietary and behavioral recommendations were given and (if necessary prescriptions were given), along with follow-up instructions. The patient will return in several weeks for followup and possible additional banding as required.  No complications were encountered and the patient tolerated the procedure well.   Annitta Needs, PhD, ANP-BC [facility]

## 2022-02-24 ENCOUNTER — Encounter: Payer: Self-pay | Admitting: Gastroenterology

## 2022-03-03 DIAGNOSIS — I10 Essential (primary) hypertension: Secondary | ICD-10-CM | POA: Diagnosis not present

## 2022-03-03 DIAGNOSIS — G4733 Obstructive sleep apnea (adult) (pediatric): Secondary | ICD-10-CM | POA: Diagnosis not present

## 2022-03-03 DIAGNOSIS — Z6841 Body Mass Index (BMI) 40.0 and over, adult: Secondary | ICD-10-CM | POA: Diagnosis not present

## 2022-03-03 DIAGNOSIS — Z299 Encounter for prophylactic measures, unspecified: Secondary | ICD-10-CM | POA: Diagnosis not present

## 2022-03-03 DIAGNOSIS — M7552 Bursitis of left shoulder: Secondary | ICD-10-CM | POA: Diagnosis not present

## 2022-03-03 DIAGNOSIS — Z789 Other specified health status: Secondary | ICD-10-CM | POA: Diagnosis not present

## 2022-03-08 IMAGING — US US ABDOMEN COMPLETE
1 series · 14 of 25 positions shown · non-contrast
Comparison: Abdominal ultrasound 12/08/2020.

CLINICAL DATA: Fatty liver, concern for cirrhosis.

EXAM:
ABDOMEN ULTRASOUND COMPLETE

[Series 1: us abdomen complete · 0.24mm/px · 14 of 77 slices shown]
[im 1/77]
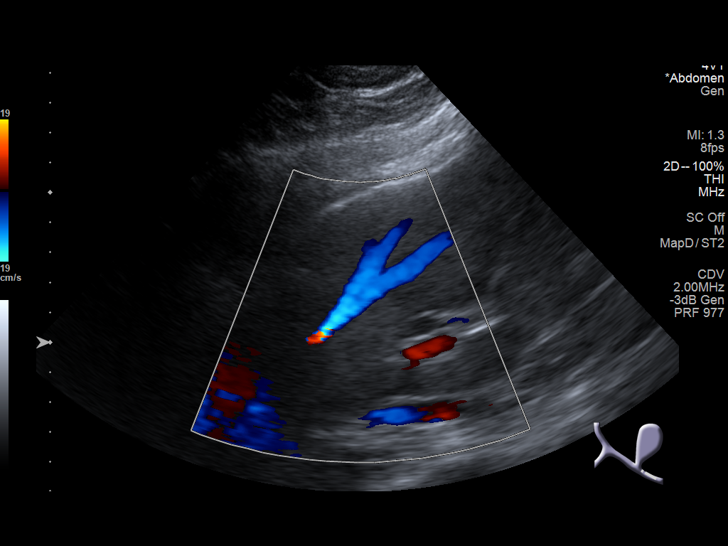
[im 7/77]
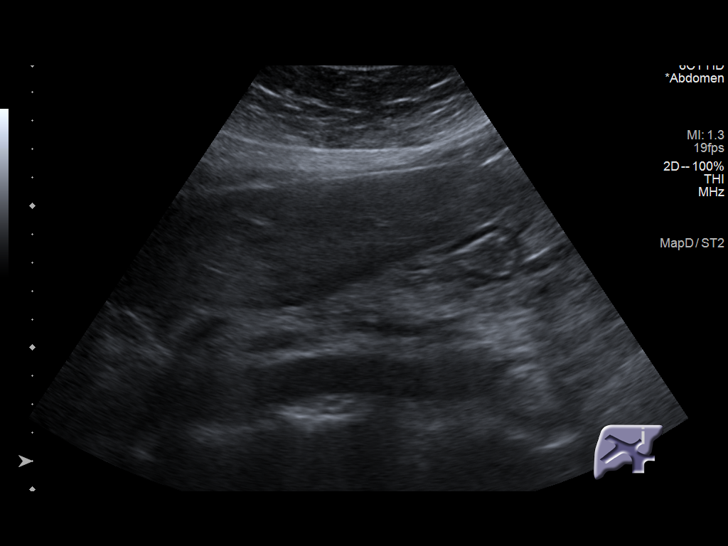
[im 13/77]
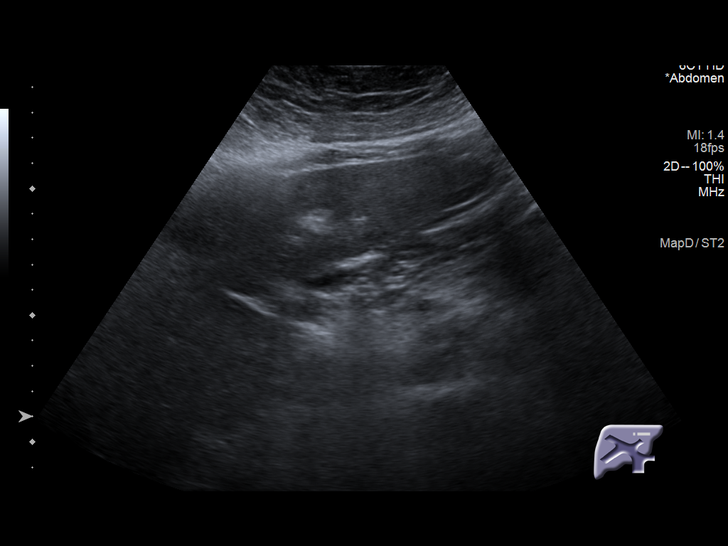
[im 20/77]
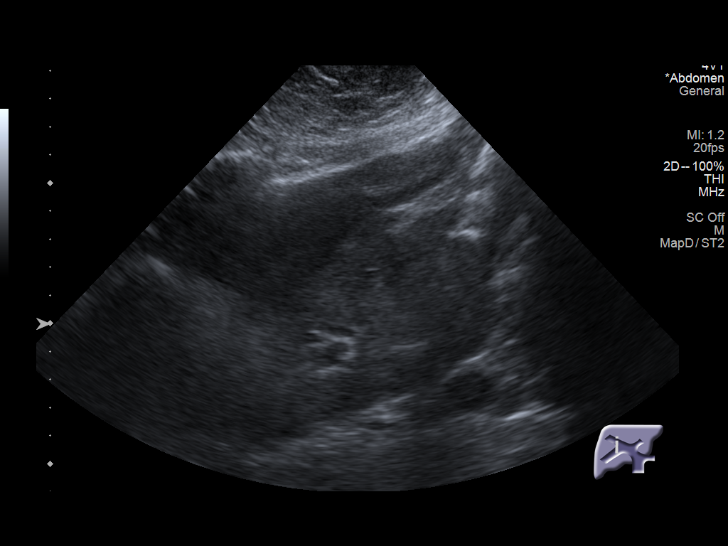
[im 26/77]
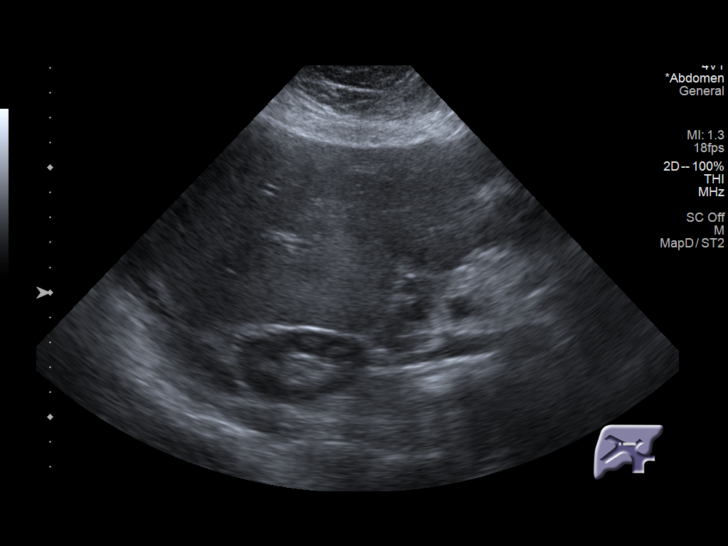
[im 29/77]
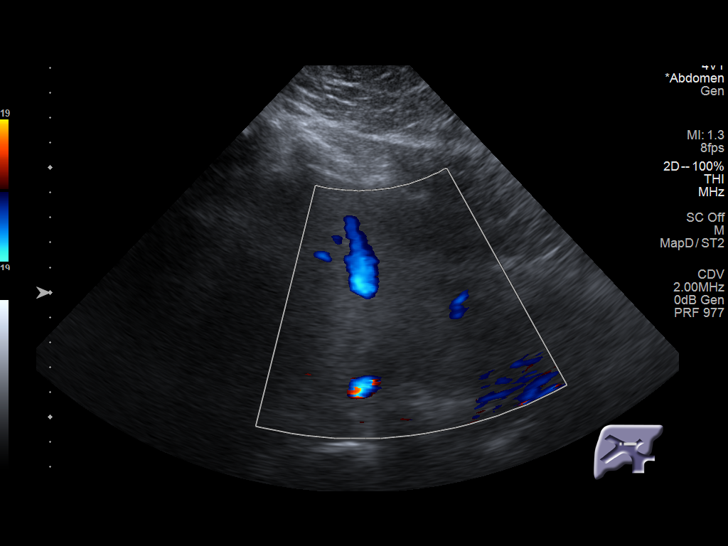
[im 35/77]
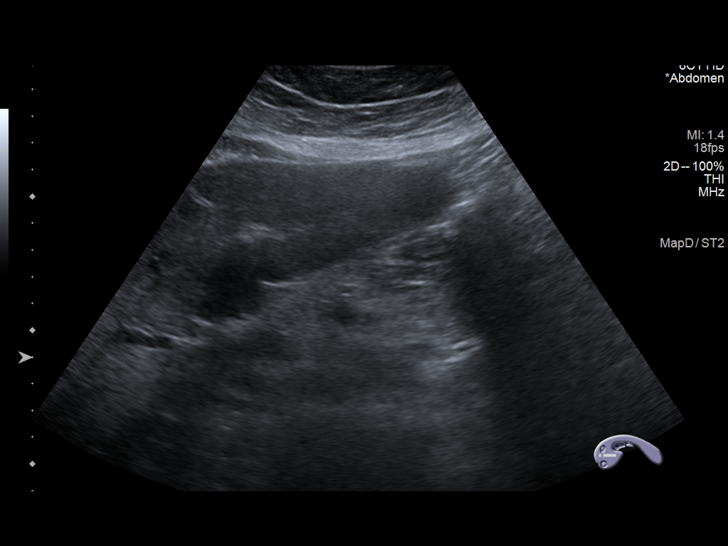
[im 42/77]
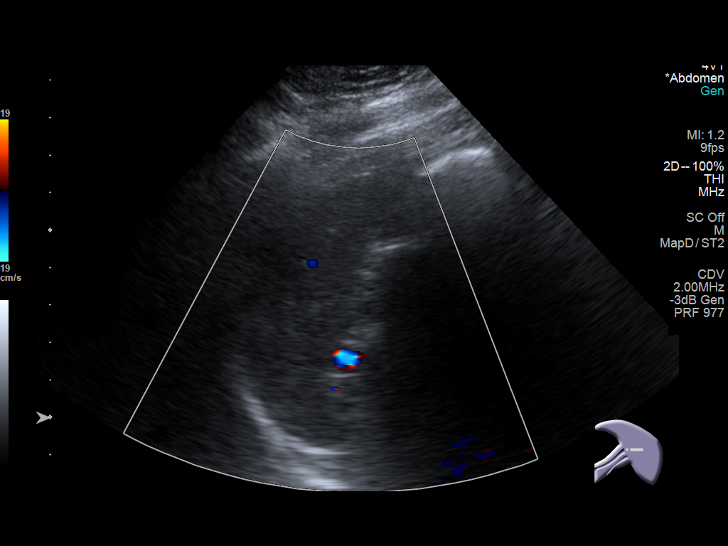
[im 48/77]
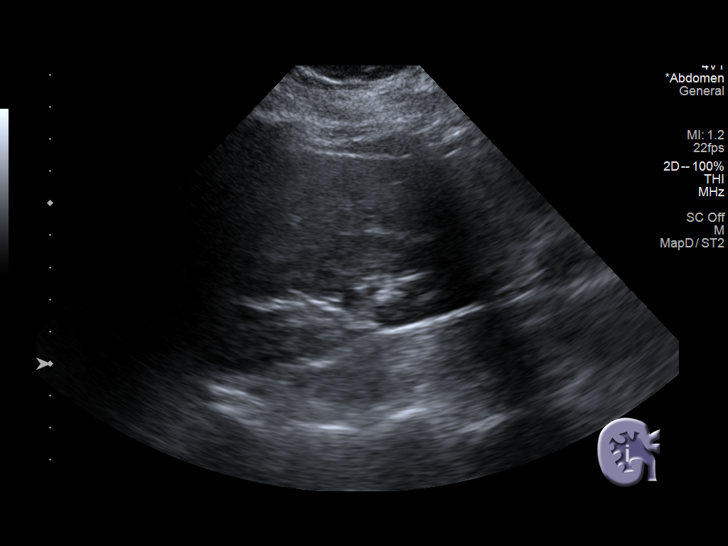
[im 51/77]
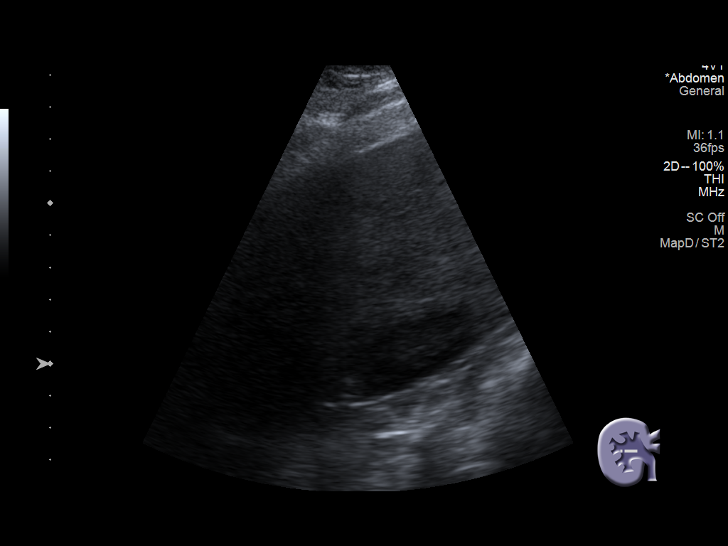
[im 58/77]
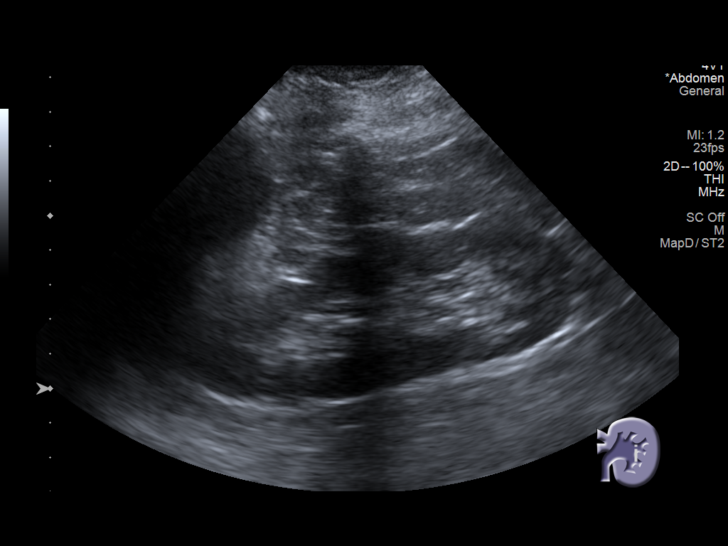
[im 64/77]
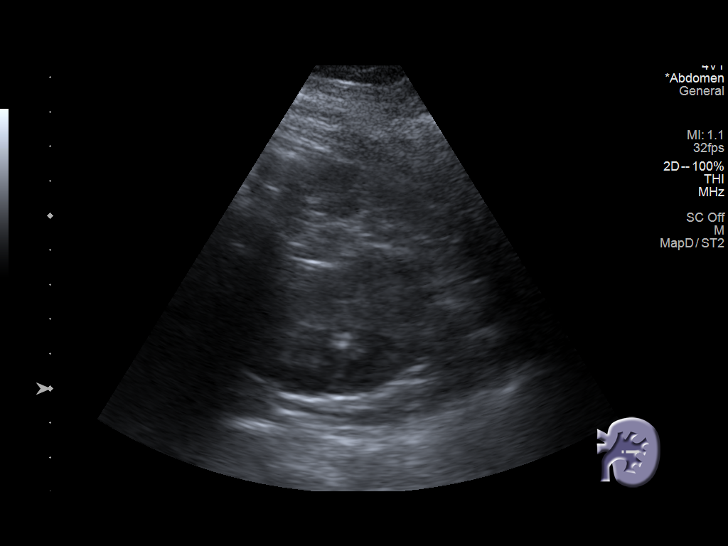
[im 70/77]
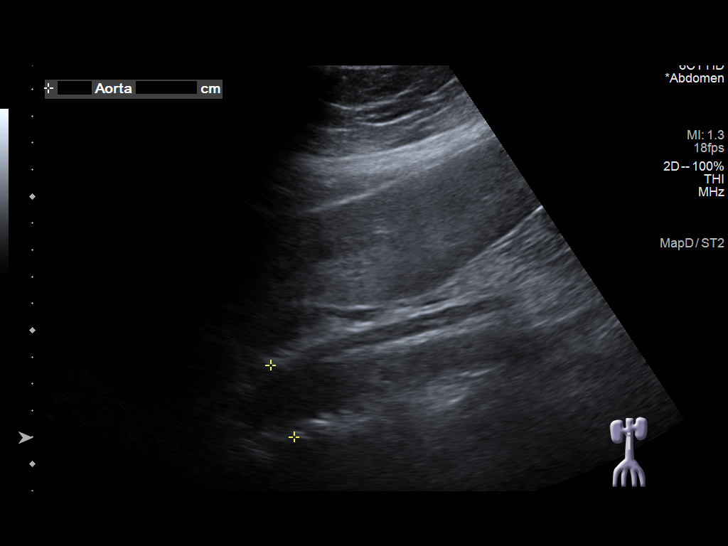
[im 77/77]
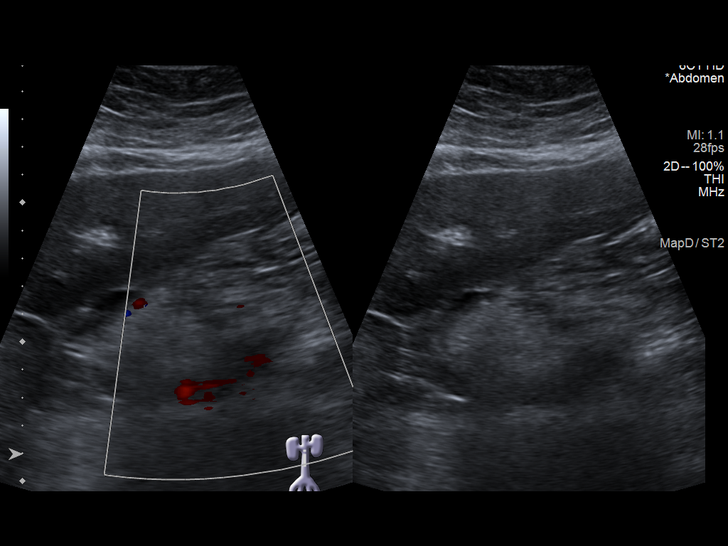

[14 of 25 positions shown; findings below may reference images not displayed]

FINDINGS: Gallbladder: Surgically absent.

Common bile duct: Diameter: 7 mm

Liver: No focal lesion identified. The parenchyma is diffusely
heterogeneous and increased. Portal vein is patent on color Doppler
imaging with normal direction of blood flow towards the liver.

IVC: No abnormality visualized.

Pancreas: Not well seen secondary to overlying bowel gas.

Spleen: Size and appearance within normal limits.

Right Kidney: Length: 11.1 cm. Echogenicity within normal limits. No
mass or hydronephrosis visualized.

Left Kidney: Length: 11.3 cm. Echogenicity within normal limits. No
mass or hydronephrosis visualized.

Abdominal aorta: No aneurysm visualized.  2.8 cm.

Other findings: None.
IMPRESSION: 1. Heterogeneous echogenic liver likely related to diffuse
hepatocellular disease such as fatty infiltration and/or cirrhosis.
No focal liver lesion identified.

## 2022-03-10 ENCOUNTER — Other Ambulatory Visit: Payer: Self-pay | Admitting: Internal Medicine

## 2022-03-10 ENCOUNTER — Other Ambulatory Visit: Payer: Self-pay

## 2022-03-10 ENCOUNTER — Ambulatory Visit: Payer: Medicare PPO | Admitting: Gastroenterology

## 2022-03-10 VITALS — BP 126/68 | HR 68 | Temp 96.8°F | Ht 63.0 in | Wt 243.5 lb

## 2022-03-10 DIAGNOSIS — K625 Hemorrhage of anus and rectum: Secondary | ICD-10-CM

## 2022-03-10 DIAGNOSIS — K641 Second degree hemorrhoids: Secondary | ICD-10-CM | POA: Diagnosis not present

## 2022-03-10 NOTE — Patient Instructions (Signed)
We are arranging a colonoscopy with Dr. Abbey Chatters in the near future. ? ?This was the 3rd banding, so you may not need anymore! However, if you do, let me know! ? ?We will see you in 3 months! ? ?I enjoyed seeing you again today! As you know, I value our relationship and want to provide genuine, compassionate, and quality care. I welcome your feedback. If you receive a survey regarding your visit,  I greatly appreciate you taking time to fill this out. See you next time! ? ?Annitta Needs, PhD, ANP-BC ?[place] [facility]  ? ?

## 2022-03-10 NOTE — Progress Notes (Signed)
? ? ?  [place] BANDING PROCEDURE NOTE ? ?Amanda Hutchinson is a 66 y.o. female presenting today for consideration of hemorrhoid banding. Last colonoscopy  in 2014 with external hemorrhoids and sigmoid diverticulosis. She has pressure, itching, burning, leaking/soiling. No rectal bleeding. She has had left lateral banding and right posterior banding. She notes that she had discomfort with the last banding from the lidocaine. Requesting not to use that now. She now has rectal bleeding.  ? ? ?The patient presents with symptomatic grade 2 hemorrhoids, unresponsive to maximal medical therapy, requesting rubber band ligation of her hemorrhoidal disease. All risks, benefits, and alternative forms of therapy were described and informed consent was obtained. ? ? ?The decision was made to band the right anterior internal hemorrhoid, and the [place] was used to perform band ligation without complication. Digital anorectal examination was then performed to assure proper positioning of the band, and to adjust the banded tissue as required. The patient was discharged home without pain or other issues. Dietary and behavioral recommendations were given, along with follow-up instructions. The patient will return in several weeks for followup and possible additional banding as required. ? ?No complications were encountered and the patient tolerated the procedure well.  ? ? ?As she now has rectal bleeding, we will pursue colonoscopy with Dr. Abbey Chatters. I discussed risks and benefits today. She may need additional banding thereafter. Return in 3 months.  ? ?Amanda Needs, PhD, ANP-BC ?[facility]  ? ?

## 2022-03-11 ENCOUNTER — Telehealth: Payer: Self-pay | Admitting: *Deleted

## 2022-03-11 MED ORDER — PEG 3350-KCL-NA BICARB-NACL 420 G PO SOLR: ORAL | 0 refills | Status: DC

## 2022-03-11 NOTE — Telephone Encounter (Signed)
Called pt. She has been scheduled for TCS with propofol asa 3 Dr. Abbey Chatters on 4/3 at [facility] will call back with pre-op appt. Will mail prep instructions and send rx to pharmacy ([facility]). ? ? ?PA approved. Auth# [id number], DOS: 03/28/2022 - 06/26/2022 ?

## 2022-03-14 NOTE — Telephone Encounter (Signed)
Called pt and LMOVM with pre-op appt details ?

## 2022-03-16 ENCOUNTER — Ambulatory Visit: Payer: Medicare HMO | Admitting: Gastroenterology

## 2022-03-19 DIAGNOSIS — G4733 Obstructive sleep apnea (adult) (pediatric): Secondary | ICD-10-CM | POA: Diagnosis not present

## 2022-03-22 NOTE — Patient Instructions (Signed)
?   Your procedure is scheduled on: 03/28/2022 ? Report to [facility] Entrance at    8:00 AM. ? Call this number if you have problems the morning of surgery: [phone] ? ? Remember: ? ?            Follow Directions on the letter you received from Your Physician's office regarding the Bowel Prep ? ?            No Smoking the day of Procedure : ? ? Take these medicines the morning of surgery with A SIP OF WATER: Amlodipine, Atenolol, Gabapentin, and pantoprazole ? ? Do not wear jewelry, make-up or nail polish. ?  ? Do not bring valuables to the hospital. ? Contacts, dentures or bridgework may not be worn into surgery. ? . ? ? Patients discharged the day of surgery will not be allowed to drive home. ?  ?  ?Colonoscopy, Adult, Care After ?This sheet gives you information about how to care for yourself after your procedure. Your health care provider may also give you more specific instructions. If you have problems or questions, contact your health care provider. ?What can I expect after the procedure? ?After the procedure, it is common to have: ?A small amount of blood in your stool for 24 hours after the procedure. ?Some gas. ?Mild abdominal cramping or bloating. ? ?Follow these instructions at home: ?General instructions ? ?For the first 24 hours after the procedure: ?Do not drive or use machinery. ?Do not sign important documents. ?Do not drink alcohol. ?Do your regular daily activities at a slower pace than normal. ?Eat soft, easy-to-digest foods. ?Rest often. ?Take over-the-counter or prescription medicines only as told by your health care provider. ?It is up to you to get the results of your procedure. Ask your health care provider, or the department performing the procedure, when your results will be ready. ?Relieving cramping and bloating ?Try walking around when you have cramps or feel bloated. ?Apply heat to your abdomen as told by your health care provider. Use a heat source that your health care  provider recommends, such as a moist heat pack or a heating pad. ?Place a towel between your skin and the heat source. ?Leave the heat on for 20-30 minutes. ?Remove the heat if your skin turns bright red. This is especially important if you are unable to feel pain, heat, or cold. You may have a greater risk of getting burned. ?Eating and drinking ?Drink enough fluid to keep your urine clear or pale yellow. ?Resume your normal diet as instructed by your health care provider. Avoid heavy or fried foods that are hard to digest. ?Avoid drinking alcohol for as long as instructed by your health care provider. ?Contact a health care provider if: ?You have blood in your stool 2-3 days after the procedure. ?Get help right away if: ?You have more than a small spotting of blood in your stool. ?You pass large blood clots in your stool. ?Your abdomen is swollen. ?You have nausea or vomiting. ?You have a fever. ?You have increasing abdominal pain that is not relieved with medicine. ?This information is not intended to replace advice given to you by your health care provider. Make sure you discuss any questions you have with your health care provider. ?Document Released: 07/26/2004 Document Revised: 09/05/2016 Document Reviewed: 02/23/2016 ?Elsevier Interactive Patient Education ? 2018 [place].  ?

## 2022-03-23 ENCOUNTER — Other Ambulatory Visit: Payer: Self-pay

## 2022-03-23 ENCOUNTER — Encounter (HOSPITAL_COMMUNITY): Payer: Self-pay

## 2022-03-23 ENCOUNTER — Encounter (HOSPITAL_COMMUNITY)
Admission: RE | Admit: 2022-03-23 | Discharge: 2022-03-23 | Disposition: A | Discharge status: Home / Self Care | Payer: Medicare PPO | Source: Ambulatory Visit | Attending: Internal Medicine | Admitting: Internal Medicine

## 2022-03-28 ENCOUNTER — Ambulatory Visit (HOSPITAL_COMMUNITY)
Admission: RE | Admit: 2022-03-28 | Discharge: 2022-03-28 | Disposition: A | Discharge status: Home / Self Care | Payer: Medicare PPO | Attending: Internal Medicine | Admitting: Internal Medicine

## 2022-03-28 ENCOUNTER — Ambulatory Visit (HOSPITAL_BASED_OUTPATIENT_CLINIC_OR_DEPARTMENT_OTHER): Payer: Medicare PPO | Admitting: Anesthesiology

## 2022-03-28 ENCOUNTER — Ambulatory Visit (HOSPITAL_COMMUNITY): Payer: Medicare PPO | Admitting: Anesthesiology

## 2022-03-28 ENCOUNTER — Encounter (HOSPITAL_COMMUNITY): Payer: Self-pay

## 2022-03-28 ENCOUNTER — Encounter (HOSPITAL_COMMUNITY)
Admission: RE | Disposition: A | Discharge status: Home / Self Care | Payer: Self-pay | Source: Home / Self Care | Attending: Internal Medicine

## 2022-03-28 DIAGNOSIS — K6289 Other specified diseases of anus and rectum: Secondary | ICD-10-CM

## 2022-03-28 DIAGNOSIS — G4730 Sleep apnea, unspecified: Secondary | ICD-10-CM | POA: Diagnosis not present

## 2022-03-28 DIAGNOSIS — Z6841 Body Mass Index (BMI) 40.0 and over, adult: Secondary | ICD-10-CM | POA: Insufficient documentation

## 2022-03-28 DIAGNOSIS — K5730 Diverticulosis of large intestine without perforation or abscess without bleeding: Secondary | ICD-10-CM

## 2022-03-28 DIAGNOSIS — D123 Benign neoplasm of transverse colon: Secondary | ICD-10-CM | POA: Diagnosis not present

## 2022-03-28 DIAGNOSIS — I10 Essential (primary) hypertension: Secondary | ICD-10-CM | POA: Diagnosis not present

## 2022-03-28 DIAGNOSIS — K219 Gastro-esophageal reflux disease without esophagitis: Secondary | ICD-10-CM | POA: Insufficient documentation

## 2022-03-28 DIAGNOSIS — K635 Polyp of colon: Secondary | ICD-10-CM | POA: Diagnosis not present

## 2022-03-28 DIAGNOSIS — K648 Other hemorrhoids: Secondary | ICD-10-CM | POA: Insufficient documentation

## 2022-03-28 DIAGNOSIS — G4733 Obstructive sleep apnea (adult) (pediatric): Secondary | ICD-10-CM | POA: Insufficient documentation

## 2022-03-28 DIAGNOSIS — Z79899 Other long term (current) drug therapy: Secondary | ICD-10-CM | POA: Diagnosis not present

## 2022-03-28 DIAGNOSIS — K921 Melena: Secondary | ICD-10-CM | POA: Diagnosis not present

## 2022-03-28 HISTORY — PX: COLONOSCOPY WITH PROPOFOL: SHX5780

## 2022-03-28 HISTORY — PX: POLYPECTOMY: SHX5525

## 2022-03-28 SURGERY — COLONOSCOPY WITH PROPOFOL
Anesthesia: General

## 2022-03-28 MED ORDER — PROPOFOL 10 MG/ML IV BOLUS
INTRAVENOUS | Status: DC | PRN
  Administered 2022-03-28: 50 mg via INTRAVENOUS
  Administered 2022-03-28: 100 mg via INTRAVENOUS
  Administered 2022-03-28: 30 mg via INTRAVENOUS

## 2022-03-28 MED ORDER — LACTATED RINGERS IV SOLN: INTRAVENOUS | Status: DC

## 2022-03-28 MED ORDER — PROPOFOL 500 MG/50ML IV EMUL
INTRAVENOUS | Status: DC | PRN
Start: 2022-03-28 — End: 2022-03-28
  Administered 2022-03-28: 150 ug/kg/min via INTRAVENOUS

## 2022-03-28 NOTE — Transfer of Care (Signed)
Immediate Anesthesia Transfer of Care Note ? ?Patient: Amanda Hutchinson ? ?Procedure(s) Performed: COLONOSCOPY WITH PROPOFOL ?POLYPECTOMY ? ?Patient Location: Short Stay ? ?Anesthesia Type:General ? ?Level of Consciousness: awake ? ?Airway & Oxygen Therapy: Patient Spontanous Breathing ? ?Post-op Assessment: Report given to RN and Post -op Vital signs reviewed and stable ? ?Post vital signs: Reviewed and stable ? ?Last Vitals:  ?Vitals Value Taken Time  ?BP 133/64 03/28/22 1023  ?Temp 36.4 ?C 03/28/22 1023  ?Pulse 87 03/28/22 1023  ?Resp 20 03/28/22 1023  ?SpO2 100 % 03/28/22 1023  ? ? ?Last Pain:  ?Vitals:  ? 03/28/22 1023  ?TempSrc: Axillary  ?PainSc:   ?   ? ?  ? ?Complications: No notable events documented. ?

## 2022-03-28 NOTE — H&P (Signed)
?Primary Care Physician:  Monico Blitz, MD ?Primary Gastroenterologist:  Dr. Abbey Chatters ? ?Pre-Procedure History & Physical: ?HPI:  Amanda Hutchinson is a 66 y.o. female is here  for a colonoscopy to be performed for rectal bleeding. ? ?Past Medical History:  ?Diagnosis Date  ? BMI 40.0-44.9, adult ([place])   ? Hypertension   ? Sleep apnea   ? ? ?Past Surgical History:  ?Procedure Laterality Date  ? BALLOON DILATION N/A 03/02/2021  ? Procedure: BALLOON DILATION;  Surgeon: Eloise Harman, DO;  Location: [facility];  Service: Endoscopy;  Laterality: N/A;  ? BIOPSY  03/02/2021  ? Procedure: BIOPSY;  Surgeon: Eloise Harman, DO;  Location: [facility];  Service: Endoscopy;;  ? COLONOSCOPY  05/2013  ? external hemorrhoids, sigmoid diverticulosis  ? ESOPHAGOGASTRODUODENOSCOPY (EGD) WITH PROPOFOL N/A 03/02/2021  ? benign-appearing esophageal stenosis status post dilation, gastritis status post biopsy, normal duodenum.  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  ? HYSTERECTOMY ABDOMINAL WITH SALPINGECTOMY    ? LAPAROSCOPIC CHOLECYSTECTOMY    ? ? ?Prior to Admission medications   ?Medication Sig Start Date End Date Taking? Authorizing Provider  ?albuterol (VENTOLIN HFA) 108 (90 Base) MCG/ACT inhaler Inhale 1-2 puffs into the lungs every 6 (six) hours as needed for wheezing or shortness of breath.   Yes [provider]  ?amLODipine (NORVASC) 5 MG tablet Take 5 mg by mouth daily.   Yes [provider]  ?ammonium lactate (AMLACTIN) 12 % cream Apply 1 application. topically daily as needed (Eczema).   Yes [provider]  ?atenolol (TENORMIN) 25 MG tablet Take 25 mg by mouth daily.   Yes [provider]  ?benzonatate (TESSALON) 100 MG capsule Take 100 mg by mouth 3 (three) times daily.   Yes [provider]  ?Cholecalciferol (VITAMIN D) 50 MCG (2000 UT) CAPS Take 2,000 Units by mouth daily.   Yes [provider]   ?diclofenac Sodium (VOLTAREN) 1 % GEL Apply 2 g topically 2 (two) times daily. 03/03/22  Yes [provider]  ?estradiol (ESTRACE) 0.5 MG tablet Take 0.5 mg by mouth every other day. 03/31/21  Yes [provider]  ?gabapentin (NEURONTIN) 100 MG capsule Take 100 mg by mouth 2 (two) times daily. 10/22/21  Yes [provider]  ?HYDROcodone-acetaminophen (NORCO) 7.5-325 MG tablet Take 1 tablet by mouth in the morning, at noon, and at bedtime.   Yes [provider]  ?linaclotide Rolan Lipa) 290 MCG CAPS capsule Take 1 capsule (290 mcg total) by mouth daily before breakfast. 11/24/21  Yes Annitta Needs, NP  ?Milnacipran (SAVELLA) 50 MG TABS tablet Take 50 mg by mouth 2 (two) times daily.   Yes [provider]  ?pantoprazole (PROTONIX) 40 MG tablet TAKE 1 TABLET BY MOUTH TWICE DAILY BEFORE A MEAL ?Patient taking differently: Take 40 mg by mouth daily. 01/31/22  Yes Mahala Menghini, PA-C  ?polyethylene glycol-electrolytes (NULYTELY) 420 g solution As directed 03/11/22  Yes Eloise Harman, DO  ?traZODone (DESYREL) 100 MG tablet Take 100 mg by mouth at bedtime.   Yes [provider]  ? ? ?Allergies as of 03/11/2022 - Review Complete 02/10/2022  ?Allergen Reaction Noted  ? Ace inhibitors  03/13/2019  ? Chlorthalidone  03/14/2019  ? Lidocaine oint & men-meth sal Other (See Comments) 03/10/2022  ? Lidocaine-hydrocortisone ace Other (See Comments) 03/10/2022  ? Sulfamethoxazole-trimethoprim  03/13/2019  ? Tramadol  03/13/2019  ? Diphenhydramine hcl Rash 03/13/2019  ? Oxycodone  Itching and Rash 03/13/2019  ? ? ?Family History  ?Problem Relation Age of Onset  ? Colon cancer Neg Hx   ? Liver disease Neg Hx   ? ? ?Social History  ? ?Socioeconomic History  ? Marital status: Single  ?  Spouse name: Not on file  ? Number of children: Not on file  ? Years of education: Not on file  ? Highest education level: Not on file  ?Occupational History  ? Not on file  ?Tobacco Use  ? Smoking status:  Never  ? Smokeless tobacco: Never  ?Vaping Use  ? Vaping Use: Never used  ?Substance and Sexual Activity  ? Alcohol use: Never  ? Drug use: Never  ? Sexual activity: Not on file  ?Other Topics Concern  ? Not on file  ?Social History Narrative  ? Not on file  ? ?Social Determinants of Health  ? ?Financial Resource Strain: Not on file  ?Food Insecurity: Not on file  ?Transportation Needs: Not on file  ?Physical Activity: Not on file  ?Stress: Not on file  ?Social Connections: Not on file  ?Intimate Partner Violence: Not on file  ? ? ?Review of Systems: ?See HPI, otherwise negative ROS ? ?Physical Exam: ?Vital signs in last 24 hours: ?Temp:  [98 ?F (36.7 ?C)] 98 ?F (36.7 ?C) (04/03 5035) ?Pulse Rate:  [85] 85 (04/03 0948) ?Resp:  [22] 22 (04/03 0948) ?BP: (164)/(76) 164/76 (04/03 0948) ?SpO2:  [100 %] 100 % (04/03 0948) ?Weight:  [112.9 kg] 112.9 kg (04/03 0948) ?  ?General:   Alert,  Well-developed, well-nourished, pleasant and cooperative in NAD ?Head:  Normocephalic and atraumatic. ?Eyes:  Sclera clear, no icterus.   Conjunctiva pink. ?Ears:  Normal auditory acuity. ?Nose:  No deformity, discharge,  or lesions. ?Mouth:  No deformity or lesions, dentition normal. ?Neck:  Supple; no masses or thyromegaly. ?Lungs:  Clear throughout to auscultation.   No wheezes, crackles, or rhonchi. No acute distress. ?Heart:  Regular rate and rhythm; no murmurs, clicks, rubs,  or gallops. ?Abdomen:  Soft, nontender and nondistended. No masses, hepatosplenomegaly or hernias noted. Normal bowel sounds, without guarding, and without rebound.   ?Msk:  Symmetrical without gross deformities. Normal posture. ?Extremities:  Without clubbing or edema. ?Neurologic:  Alert and  oriented x4;  grossly normal neurologically. ?Skin:  Intact without significant lesions or rashes. ?Cervical Nodes:  No significant cervical adenopathy. ?Psych:  Alert and cooperative. Normal mood and affect. ? ?Impression/Plan: ?Amanda Hutchinson is here for a  colonoscopy to be performed for rectal bleeding.  ? ?The risks of the procedure including infection, bleed, or perforation as well as benefits, limitations, alternatives and imponderables have been reviewed with the patient. Questions have been answered. All parties agreeable. ? ?

## 2022-03-28 NOTE — Op Note (Signed)
[facility] ?Patient Name: Amanda Hutchinson ?Procedure Date: 03/28/2022 9:47 AM ?MRN: [id number] ?Date of Birth: 03-29-1956 ?Attending MD: Elon Alas. Abbey Chatters , DO ?CSN: [id number] ?Age: 66 ?Admit Type: Outpatient ?Procedure:                Colonoscopy ?Indications:              Hematochezia ?Providers:                Elon Alas. Abbey Chatters, DO, Caprice Kluver, Aram Candela ?Referring MD:              ?Medicines:                See the Anesthesia note for documentation of the  ?                          administered medications ?Complications:            No immediate complications. ?Estimated Blood Loss:     Estimated blood loss was minimal. ?Procedure:                Pre-Anesthesia Assessment: ?                          - The anesthesia plan was to use monitored  ?                          anesthesia care (MAC). ?                          After obtaining informed consent, the colonoscope  ?                          was passed under direct vision. Throughout the  ?                          procedure, the patient's blood pressure, pulse, and  ?                          oxygen saturations were monitored continuously. The  ?                          PCF-HQ190L ([id number]) scope was introduced through  ?                          the anus and advanced to the the cecum, identified  ?                          by appendiceal orifice and ileocecal valve. The  ?                          colonoscopy was performed without difficulty. The  ?                          patient tolerated the procedure well. The quality  ?                          of the bowel preparation was  evaluated using the  ?                          BBPS [facility] Bowel Preparation Scale) with scores  ?                          of: Right Colon = 3, Transverse Colon = 3 and Left  ?                          Colon = 3 (entire mucosa seen well with no residual  ?                          staining, small fragments of stool or opaque  ?                          liquid). The total  BBPS score equals 9. ?Scope In: 10:02:31 AM ?Scope Out: 10:18:39 AM ?Scope Withdrawal Time: 0 hours 13 minutes 5 seconds  ?Total Procedure Duration: 0 hours 16 minutes 8 seconds  ?Findings: ?     Non-bleeding internal hemorrhoids were found during retroflexion. ?     A 5 mm scar was found in the rectum. ?     Multiple medium-mouthed diverticula were found in the sigmoid colon. ?     Three sessile polyps were found in the transverse colon. The polyps were  ?     5 to 8 mm in size. These polyps were removed with a cold snare.  ?     Resection and retrieval were complete. ?     The exam was otherwise without abnormality. ?Impression:               - Non-bleeding internal hemorrhoids. ?                          - Scar in the rectum. ?                          - Diverticulosis in the sigmoid colon. ?                          - Three 5 to 8 mm polyps in the transverse colon,  ?                          removed with a cold snare. Resected and retrieved. ?                          - The examination was otherwise normal. ?Moderate Sedation: ?     Per Anesthesia Care ?Recommendation:           - Patient has a contact number available for  ?                          emergencies. The signs and symptoms of potential  ?                          delayed complications were discussed with the  ?  patient. Return to normal activities tomorrow.  ?                          Written discharge instructions were provided to the  ?                          patient. ?                          - Resume previous diet. ?                          - Continue present medications. ?                          - Await pathology results. ?                          - Repeat colonoscopy in 5 years for surveillance. ?                          - Return to GI clinic at the next available  ?                          appointment with Roseanne Kaufman for further hemorrhoid  ?                          banding. ?Procedure Code(s):        ---  Professional --- ?                          [phone], Colonoscopy, flexible; with removal of  ?                          tumor(s), polyp(s), or other lesion(s) by snare  ?                          technique ?Diagnosis Code(s):        --- Professional --- ?                          K62.89, Other specified diseases of anus and rectum ?                          K64.8, Other hemorrhoids ?                          K63.5, Polyp of colon ?                          K92.1, Melena (includes Hematochezia) ?                          K57.30, Diverticulosis of large intestine without  ?                          perforation or abscess without bleeding ?CPT copyright 2019 American Medical  Association. All rights reserved. ?The codes documented in this report are preliminary and upon coder review may  ?be revised to meet current compliance requirements. ?Elon Alas. Abbey Chatters, DO ?Elon Alas. Abbey Chatters, DO ?03/28/2022 10:25:06 AM ?This report has been signed electronically. ?Number of Addenda: 0 ?

## 2022-03-28 NOTE — Discharge Instructions (Signed)
?  Colonoscopy ?Discharge Instructions ? ?Read the instructions outlined below and refer to this sheet in the next few weeks. These discharge instructions provide you with general information on caring for yourself after you leave the hospital. Your doctor may also give you specific instructions. While your treatment has been planned according to the most current medical practices available, unavoidable complications occasionally occur.  ? ?ACTIVITY ?You may resume your regular activity, but move at a slower pace for the next 24 hours.  ?Take frequent rest periods for the next 24 hours.  ?Walking will help get rid of the air and reduce the bloated feeling in your belly (abdomen).  ?No driving for 24 hours (because of the medicine (anesthesia) used during the test).   ?Do not sign any important legal documents or operate any machinery for 24 hours (because of the anesthesia used during the test).  ?NUTRITION ?Drink plenty of fluids.  ?You may resume your normal diet as instructed by your doctor.  ?Begin with a light meal and progress to your normal diet. Heavy or fried foods are harder to digest and may make you feel sick to your stomach (nauseated).  ?Avoid alcoholic beverages for 24 hours or as instructed.  ?MEDICATIONS ?You may resume your normal medications unless your doctor tells you otherwise.  ?WHAT YOU CAN EXPECT TODAY ?Some feelings of bloating in the abdomen.  ?Passage of more gas than usual.  ?Spotting of blood in your stool or on the toilet paper.  ?IF YOU HAD POLYPS REMOVED DURING THE COLONOSCOPY: ?No aspirin products for 7 days or as instructed.  ?No alcohol for 7 days or as instructed.  ?Eat a soft diet for the next 24 hours.  ?FINDING OUT THE RESULTS OF YOUR TEST ?Not all test results are available during your visit. If your test results are not back during the visit, make an appointment with your caregiver to find out the results. Do not assume everything is normal if you have not heard from your  caregiver or the medical facility. It is important for you to follow up on all of your test results.  ?SEEK IMMEDIATE MEDICAL ATTENTION IF: ?You have more than a spotting of blood in your stool.  ?Your belly is swollen (abdominal distention).  ?You are nauseated or vomiting.  ?You have a temperature over 101.  ?You have abdominal pain or discomfort that is severe or gets worse throughout the day.  ? ?Your colonoscopy revealed 3 polyp(s) which I removed successfully. Await pathology results, my office will contact you. I recommend repeating colonoscopy in 5 years for surveillance purposes. You also have diverticulosis and internal hemorrhoids. I would recommend increasing fiber in your diet or adding OTC Benefiber/Metamucil. Be sure to drink at least 4 to 6 glasses of water daily. Follow-up with Vicente Males next available for hemorrhoid banding ? ? ? ?I hope you have a great rest of your week! ? ?Elon Alas. Abbey Chatters, D.O. ?Gastroenterology and Hepatology ?[facility] ? ?

## 2022-03-28 NOTE — Anesthesia Preprocedure Evaluation (Signed)
Anesthesia Evaluation  ?Patient identified by MRN, date of birth, ID band ?Patient awake ? ? ? ?Reviewed: ?Allergy & Precautions, NPO status , Patient's Chart, lab work & pertinent test results, reviewed documented beta blocker date and time  ? ?Airway ?Mallampati: II ? ?TM Distance: >3 FB ?Neck ROM: Full ? ? ? Dental ? ?(+) Dental Advisory Given, Missing,  ?  ?Pulmonary ?sleep apnea and Continuous Positive Airway Pressure Ventilation ,  ?  ?Pulmonary exam normal ?breath sounds clear to auscultation ? ? ? ? ? ? Cardiovascular ?Exercise Tolerance: Good ?hypertension, Pt. on medications and Pt. on home beta blockers ?Normal cardiovascular exam ?Rhythm:Regular Rate:Normal ? ? ?  ?Neuro/Psych ?negative neurological ROS ? negative psych ROS  ? GI/Hepatic ?Neg liver ROS, GERD  Medicated and Controlled,  ?Endo/Other  ?Morbid obesity ? Renal/GU ?negative Renal ROS  ?negative genitourinary ?  ?Musculoskeletal ?negative musculoskeletal ROS ?(+)  ? Abdominal ?  ?Peds ?negative pediatric ROS ?(+)  Hematology ?negative hematology ROS ?(+)   ?Anesthesia Other Findings ? ? Reproductive/Obstetrics ?negative OB ROS ? ?  ? ? ? ? ? ? ? ? ? ? ? ? ? ?  ?  ? ? ? ? ? ? ? ? ?Anesthesia Physical ?Anesthesia Plan ? ?ASA: 3 ? ?Anesthesia Plan: General  ? ?Post-op Pain Management: Minimal or no pain anticipated  ? ?Induction: Intravenous ? ?PONV Risk Score and Plan: Propofol infusion ? ?Airway Management Planned: Nasal Cannula and Natural Airway ? ?Additional Equipment:  ? ?Intra-op Plan:  ? ?Post-operative Plan:  ? ?Informed Consent: I have reviewed the patients History and Physical, chart, labs and discussed the procedure including the risks, benefits and alternatives for the proposed anesthesia with the patient or authorized representative who has indicated his/her understanding and acceptance.  ? ? ? ?Dental advisory given ? ?Plan Discussed with: CRNA and Surgeon ? ?Anesthesia Plan Comments:    ? ? ? ? ? ? ?Anesthesia Quick Evaluation ? ?

## 2022-03-28 NOTE — Anesthesia Postprocedure Evaluation (Signed)
Anesthesia Post Note ? ?Patient: Amanda Hutchinson ? ?Procedure(s) Performed: COLONOSCOPY WITH PROPOFOL ?POLYPECTOMY ? ?Patient location during evaluation: Phase II ?Anesthesia Type: General ?Level of consciousness: awake and alert and oriented ?Pain management: pain level controlled ?Vital Signs Assessment: post-procedure vital signs reviewed and stable ?Respiratory status: spontaneous breathing, nonlabored ventilation and respiratory function stable ?Cardiovascular status: blood pressure returned to baseline and stable ?Postop Assessment: no apparent nausea or vomiting ?Anesthetic complications: no ? ? ?No notable events documented. ? ? ?Last Vitals:  ?Vitals:  ? 03/28/22 0948 03/28/22 1023  ?BP: (!) 164/76 133/64  ?Pulse: 85 87  ?Resp: (!) 22 20  ?Temp: 36.7 ?C 36.4 ?C  ?SpO2: 100% 100%  ?  ?Last Pain:  ?Vitals:  ? 03/28/22 1023  ?TempSrc: Axillary  ?PainSc:   ? ? ?  ?  ?  ?  ?  ?  ? ?Finleigh Cheong C Sayre Mazor ? ? ? ? ?

## 2022-03-29 DIAGNOSIS — G4733 Obstructive sleep apnea (adult) (pediatric): Secondary | ICD-10-CM | POA: Diagnosis not present

## 2022-03-29 LAB — SURGICAL PATHOLOGY

## 2022-04-04 ENCOUNTER — Encounter (HOSPITAL_COMMUNITY): Payer: Self-pay | Admitting: Internal Medicine

## 2022-04-13 DIAGNOSIS — Z1339 Encounter for screening examination for other mental health and behavioral disorders: Secondary | ICD-10-CM | POA: Diagnosis not present

## 2022-04-13 DIAGNOSIS — I10 Essential (primary) hypertension: Secondary | ICD-10-CM | POA: Diagnosis not present

## 2022-04-13 DIAGNOSIS — Z1331 Encounter for screening for depression: Secondary | ICD-10-CM | POA: Diagnosis not present

## 2022-04-13 DIAGNOSIS — Z7189 Other specified counseling: Secondary | ICD-10-CM | POA: Diagnosis not present

## 2022-04-13 DIAGNOSIS — Z299 Encounter for prophylactic measures, unspecified: Secondary | ICD-10-CM | POA: Diagnosis not present

## 2022-04-13 DIAGNOSIS — Z6841 Body Mass Index (BMI) 40.0 and over, adult: Secondary | ICD-10-CM | POA: Diagnosis not present

## 2022-04-13 DIAGNOSIS — M797 Fibromyalgia: Secondary | ICD-10-CM | POA: Diagnosis not present

## 2022-04-13 DIAGNOSIS — Z0000 Encounter for general adult medical examination without abnormal findings: Secondary | ICD-10-CM | POA: Diagnosis not present

## 2022-04-19 DIAGNOSIS — G4733 Obstructive sleep apnea (adult) (pediatric): Secondary | ICD-10-CM | POA: Diagnosis not present

## 2022-04-21 DIAGNOSIS — M797 Fibromyalgia: Secondary | ICD-10-CM | POA: Diagnosis not present

## 2022-04-21 DIAGNOSIS — M5412 Radiculopathy, cervical region: Secondary | ICD-10-CM | POA: Diagnosis not present

## 2022-04-21 DIAGNOSIS — M19072 Primary osteoarthritis, left ankle and foot: Secondary | ICD-10-CM | POA: Diagnosis not present

## 2022-04-21 DIAGNOSIS — Z79899 Other long term (current) drug therapy: Secondary | ICD-10-CM | POA: Diagnosis not present

## 2022-04-21 DIAGNOSIS — M5416 Radiculopathy, lumbar region: Secondary | ICD-10-CM | POA: Diagnosis not present

## 2022-04-26 DIAGNOSIS — M47812 Spondylosis without myelopathy or radiculopathy, cervical region: Secondary | ICD-10-CM | POA: Diagnosis not present

## 2022-04-26 DIAGNOSIS — M19011 Primary osteoarthritis, right shoulder: Secondary | ICD-10-CM | POA: Diagnosis not present

## 2022-04-26 DIAGNOSIS — M19012 Primary osteoarthritis, left shoulder: Secondary | ICD-10-CM | POA: Diagnosis not present

## 2022-04-26 DIAGNOSIS — M419 Scoliosis, unspecified: Secondary | ICD-10-CM | POA: Diagnosis not present

## 2022-04-26 DIAGNOSIS — M4722 Other spondylosis with radiculopathy, cervical region: Secondary | ICD-10-CM | POA: Diagnosis not present

## 2022-04-28 DIAGNOSIS — G4733 Obstructive sleep apnea (adult) (pediatric): Secondary | ICD-10-CM | POA: Diagnosis not present

## 2022-05-09 ENCOUNTER — Encounter: Payer: Self-pay | Admitting: Internal Medicine

## 2022-05-19 DIAGNOSIS — M25561 Pain in right knee: Secondary | ICD-10-CM | POA: Diagnosis not present

## 2022-05-19 DIAGNOSIS — I10 Essential (primary) hypertension: Secondary | ICD-10-CM | POA: Diagnosis not present

## 2022-05-19 DIAGNOSIS — M25562 Pain in left knee: Secondary | ICD-10-CM | POA: Diagnosis not present

## 2022-05-19 DIAGNOSIS — G4733 Obstructive sleep apnea (adult) (pediatric): Secondary | ICD-10-CM | POA: Diagnosis not present

## 2022-05-19 DIAGNOSIS — J069 Acute upper respiratory infection, unspecified: Secondary | ICD-10-CM | POA: Diagnosis not present

## 2022-05-19 DIAGNOSIS — Z9181 History of falling: Secondary | ICD-10-CM | POA: Diagnosis not present

## 2022-05-19 DIAGNOSIS — Z299 Encounter for prophylactic measures, unspecified: Secondary | ICD-10-CM | POA: Diagnosis not present

## 2022-05-30 DIAGNOSIS — J302 Other seasonal allergic rhinitis: Secondary | ICD-10-CM | POA: Diagnosis not present

## 2022-05-30 DIAGNOSIS — G4733 Obstructive sleep apnea (adult) (pediatric): Secondary | ICD-10-CM | POA: Diagnosis not present

## 2022-05-30 DIAGNOSIS — G4700 Insomnia, unspecified: Secondary | ICD-10-CM | POA: Diagnosis not present

## 2022-05-30 DIAGNOSIS — R059 Cough, unspecified: Secondary | ICD-10-CM | POA: Diagnosis not present

## 2022-06-01 DIAGNOSIS — R413 Other amnesia: Secondary | ICD-10-CM | POA: Diagnosis not present

## 2022-06-01 DIAGNOSIS — G4733 Obstructive sleep apnea (adult) (pediatric): Secondary | ICD-10-CM | POA: Diagnosis not present

## 2022-06-01 DIAGNOSIS — Z299 Encounter for prophylactic measures, unspecified: Secondary | ICD-10-CM | POA: Diagnosis not present

## 2022-06-01 DIAGNOSIS — Z6841 Body Mass Index (BMI) 40.0 and over, adult: Secondary | ICD-10-CM | POA: Diagnosis not present

## 2022-06-01 DIAGNOSIS — I10 Essential (primary) hypertension: Secondary | ICD-10-CM | POA: Diagnosis not present

## 2022-06-01 DIAGNOSIS — Z0000 Encounter for general adult medical examination without abnormal findings: Secondary | ICD-10-CM | POA: Diagnosis not present

## 2022-06-08 ENCOUNTER — Ambulatory Visit
Admission: RE | Admit: 2022-06-08 | Discharge: 2022-06-08 | Disposition: A | Discharge status: Home / Self Care | Payer: Medicare PPO | Source: Ambulatory Visit | Attending: Internal Medicine | Admitting: Internal Medicine

## 2022-06-08 DIAGNOSIS — Z299 Encounter for prophylactic measures, unspecified: Secondary | ICD-10-CM | POA: Diagnosis not present

## 2022-06-08 DIAGNOSIS — I10 Essential (primary) hypertension: Secondary | ICD-10-CM | POA: Diagnosis not present

## 2022-06-08 DIAGNOSIS — J069 Acute upper respiratory infection, unspecified: Secondary | ICD-10-CM | POA: Diagnosis not present

## 2022-06-08 DIAGNOSIS — M1990 Unspecified osteoarthritis, unspecified site: Secondary | ICD-10-CM | POA: Diagnosis not present

## 2022-06-08 DIAGNOSIS — Z6841 Body Mass Index (BMI) 40.0 and over, adult: Secondary | ICD-10-CM | POA: Diagnosis not present

## 2022-06-08 DIAGNOSIS — Z1231 Encounter for screening mammogram for malignant neoplasm of breast: Secondary | ICD-10-CM | POA: Diagnosis not present

## 2022-06-10 ENCOUNTER — Other Ambulatory Visit: Payer: Self-pay | Admitting: Internal Medicine

## 2022-06-10 DIAGNOSIS — R928 Other abnormal and inconclusive findings on diagnostic imaging of breast: Secondary | ICD-10-CM

## 2022-06-13 DIAGNOSIS — M25511 Pain in right shoulder: Secondary | ICD-10-CM | POA: Diagnosis not present

## 2022-06-13 DIAGNOSIS — M542 Cervicalgia: Secondary | ICD-10-CM | POA: Diagnosis not present

## 2022-06-16 ENCOUNTER — Ambulatory Visit
Admission: RE | Admit: 2022-06-16 | Discharge: 2022-06-16 | Disposition: A | Discharge status: Home / Self Care | Payer: Medicare PPO | Source: Ambulatory Visit | Attending: Internal Medicine | Admitting: Internal Medicine

## 2022-06-16 DIAGNOSIS — R928 Other abnormal and inconclusive findings on diagnostic imaging of breast: Secondary | ICD-10-CM

## 2022-06-16 DIAGNOSIS — N6002 Solitary cyst of left breast: Secondary | ICD-10-CM | POA: Diagnosis not present

## 2022-06-17 DIAGNOSIS — M25511 Pain in right shoulder: Secondary | ICD-10-CM | POA: Diagnosis not present

## 2022-06-17 DIAGNOSIS — M542 Cervicalgia: Secondary | ICD-10-CM | POA: Diagnosis not present

## 2022-06-19 DIAGNOSIS — G4733 Obstructive sleep apnea (adult) (pediatric): Secondary | ICD-10-CM | POA: Diagnosis not present

## 2022-06-21 DIAGNOSIS — M25511 Pain in right shoulder: Secondary | ICD-10-CM | POA: Diagnosis not present

## 2022-06-21 DIAGNOSIS — M542 Cervicalgia: Secondary | ICD-10-CM | POA: Diagnosis not present

## 2022-06-24 DIAGNOSIS — M542 Cervicalgia: Secondary | ICD-10-CM | POA: Diagnosis not present

## 2022-06-24 DIAGNOSIS — M25511 Pain in right shoulder: Secondary | ICD-10-CM | POA: Diagnosis not present

## 2022-07-04 DIAGNOSIS — G4733 Obstructive sleep apnea (adult) (pediatric): Secondary | ICD-10-CM | POA: Diagnosis not present

## 2022-07-05 DIAGNOSIS — M25511 Pain in right shoulder: Secondary | ICD-10-CM | POA: Diagnosis not present

## 2022-07-05 DIAGNOSIS — M542 Cervicalgia: Secondary | ICD-10-CM | POA: Diagnosis not present

## 2022-07-06 DIAGNOSIS — M5412 Radiculopathy, cervical region: Secondary | ICD-10-CM | POA: Diagnosis not present

## 2022-07-06 DIAGNOSIS — M19072 Primary osteoarthritis, left ankle and foot: Secondary | ICD-10-CM | POA: Diagnosis not present

## 2022-07-06 DIAGNOSIS — M797 Fibromyalgia: Secondary | ICD-10-CM | POA: Diagnosis not present

## 2022-07-06 DIAGNOSIS — M25512 Pain in left shoulder: Secondary | ICD-10-CM | POA: Diagnosis not present

## 2022-07-08 DIAGNOSIS — M25511 Pain in right shoulder: Secondary | ICD-10-CM | POA: Diagnosis not present

## 2022-07-08 DIAGNOSIS — M542 Cervicalgia: Secondary | ICD-10-CM | POA: Diagnosis not present

## 2022-07-13 DIAGNOSIS — M542 Cervicalgia: Secondary | ICD-10-CM | POA: Diagnosis not present

## 2022-07-13 DIAGNOSIS — M25511 Pain in right shoulder: Secondary | ICD-10-CM | POA: Diagnosis not present

## 2022-07-19 DIAGNOSIS — G4733 Obstructive sleep apnea (adult) (pediatric): Secondary | ICD-10-CM | POA: Diagnosis not present

## 2022-07-19 DIAGNOSIS — M542 Cervicalgia: Secondary | ICD-10-CM | POA: Diagnosis not present

## 2022-07-19 DIAGNOSIS — M25511 Pain in right shoulder: Secondary | ICD-10-CM | POA: Diagnosis not present

## 2022-07-22 DIAGNOSIS — M25511 Pain in right shoulder: Secondary | ICD-10-CM | POA: Diagnosis not present

## 2022-07-22 DIAGNOSIS — M542 Cervicalgia: Secondary | ICD-10-CM | POA: Diagnosis not present

## 2022-07-29 DIAGNOSIS — M25511 Pain in right shoulder: Secondary | ICD-10-CM | POA: Diagnosis not present

## 2022-07-29 DIAGNOSIS — M542 Cervicalgia: Secondary | ICD-10-CM | POA: Diagnosis not present

## 2022-08-02 DIAGNOSIS — M25511 Pain in right shoulder: Secondary | ICD-10-CM | POA: Diagnosis not present

## 2022-08-02 DIAGNOSIS — M542 Cervicalgia: Secondary | ICD-10-CM | POA: Diagnosis not present

## 2022-08-04 DIAGNOSIS — R21 Rash and other nonspecific skin eruption: Secondary | ICD-10-CM | POA: Diagnosis not present

## 2022-08-04 DIAGNOSIS — I10 Essential (primary) hypertension: Secondary | ICD-10-CM | POA: Diagnosis not present

## 2022-08-04 DIAGNOSIS — Z6841 Body Mass Index (BMI) 40.0 and over, adult: Secondary | ICD-10-CM | POA: Diagnosis not present

## 2022-08-04 DIAGNOSIS — Z713 Dietary counseling and surveillance: Secondary | ICD-10-CM | POA: Diagnosis not present

## 2022-08-04 DIAGNOSIS — G4733 Obstructive sleep apnea (adult) (pediatric): Secondary | ICD-10-CM | POA: Diagnosis not present

## 2022-08-04 DIAGNOSIS — Z299 Encounter for prophylactic measures, unspecified: Secondary | ICD-10-CM | POA: Diagnosis not present

## 2022-08-10 DIAGNOSIS — M542 Cervicalgia: Secondary | ICD-10-CM | POA: Diagnosis not present

## 2022-08-10 DIAGNOSIS — M25511 Pain in right shoulder: Secondary | ICD-10-CM | POA: Diagnosis not present

## 2022-08-19 DIAGNOSIS — M25562 Pain in left knee: Secondary | ICD-10-CM | POA: Diagnosis not present

## 2022-08-19 DIAGNOSIS — Z299 Encounter for prophylactic measures, unspecified: Secondary | ICD-10-CM | POA: Diagnosis not present

## 2022-08-19 DIAGNOSIS — G4733 Obstructive sleep apnea (adult) (pediatric): Secondary | ICD-10-CM | POA: Diagnosis not present

## 2022-08-19 DIAGNOSIS — Z789 Other specified health status: Secondary | ICD-10-CM | POA: Diagnosis not present

## 2022-08-19 DIAGNOSIS — I10 Essential (primary) hypertension: Secondary | ICD-10-CM | POA: Diagnosis not present

## 2022-08-30 DIAGNOSIS — I10 Essential (primary) hypertension: Secondary | ICD-10-CM | POA: Diagnosis not present

## 2022-08-30 DIAGNOSIS — Z299 Encounter for prophylactic measures, unspecified: Secondary | ICD-10-CM | POA: Diagnosis not present

## 2022-08-30 DIAGNOSIS — R509 Fever, unspecified: Secondary | ICD-10-CM | POA: Diagnosis not present

## 2022-08-30 DIAGNOSIS — J069 Acute upper respiratory infection, unspecified: Secondary | ICD-10-CM | POA: Diagnosis not present

## 2022-09-19 DIAGNOSIS — G4733 Obstructive sleep apnea (adult) (pediatric): Secondary | ICD-10-CM | POA: Diagnosis not present

## 2022-09-27 DIAGNOSIS — M5412 Radiculopathy, cervical region: Secondary | ICD-10-CM | POA: Diagnosis not present

## 2022-09-27 DIAGNOSIS — M797 Fibromyalgia: Secondary | ICD-10-CM | POA: Diagnosis not present

## 2022-09-27 DIAGNOSIS — M19072 Primary osteoarthritis, left ankle and foot: Secondary | ICD-10-CM | POA: Diagnosis not present

## 2022-10-03 DIAGNOSIS — G4733 Obstructive sleep apnea (adult) (pediatric): Secondary | ICD-10-CM | POA: Diagnosis not present

## 2022-10-12 IMAGING — MG MM DIGITAL DIAGNOSTIC UNILAT*L* W/ TOMO W/ CAD
6 of 10 series · 6 of 30 positions shown · non-contrast
Comparison: Previous exam(s).

CLINICAL DATA: 65-year-old female presenting as a recall from
screening for possible left breast asymmetry.

EXAM:
DIGITAL DIAGNOSTIC UNILATERAL LEFT MAMMOGRAM WITH TOMOSYNTHESIS AND
CAD; ULTRASOUND LEFT BREAST LIMITED
TECHNIQUE: Left digital diagnostic mammography and breast tomosynthesis was
performed. The images were evaluated with computer-aided detection.;
Targeted ultrasound examination of the left breast was performed.

[L CC synth-2D (1 of 2)]
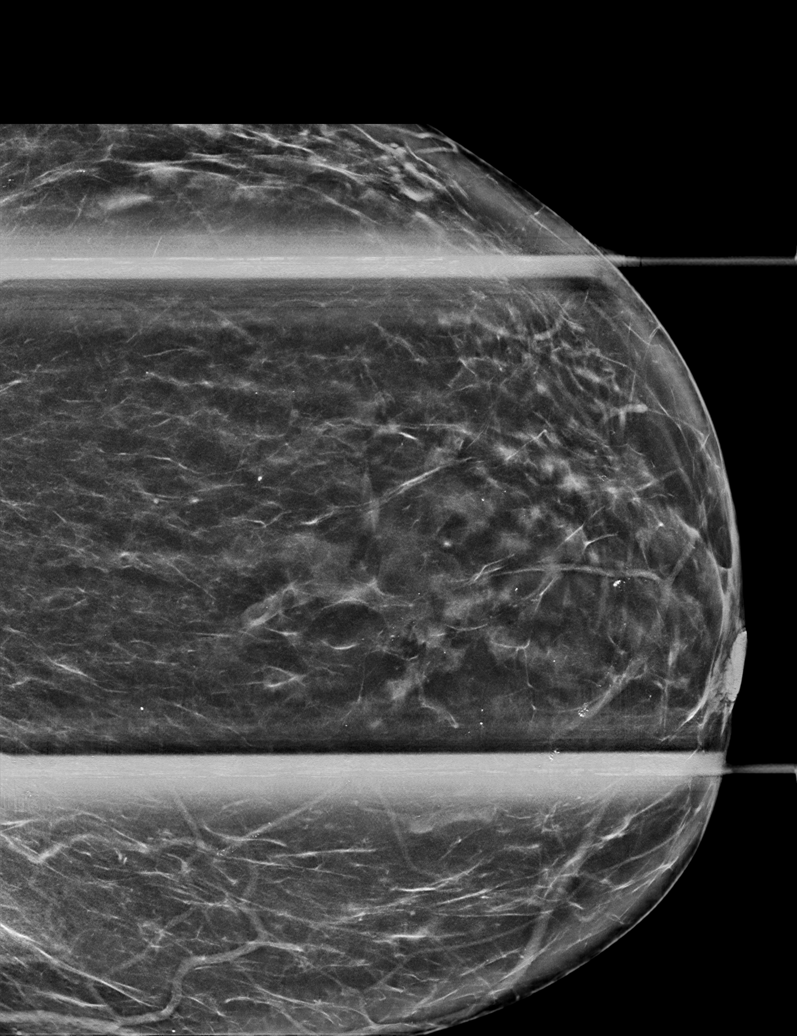

[L CC synth-2D (2 of 2)]
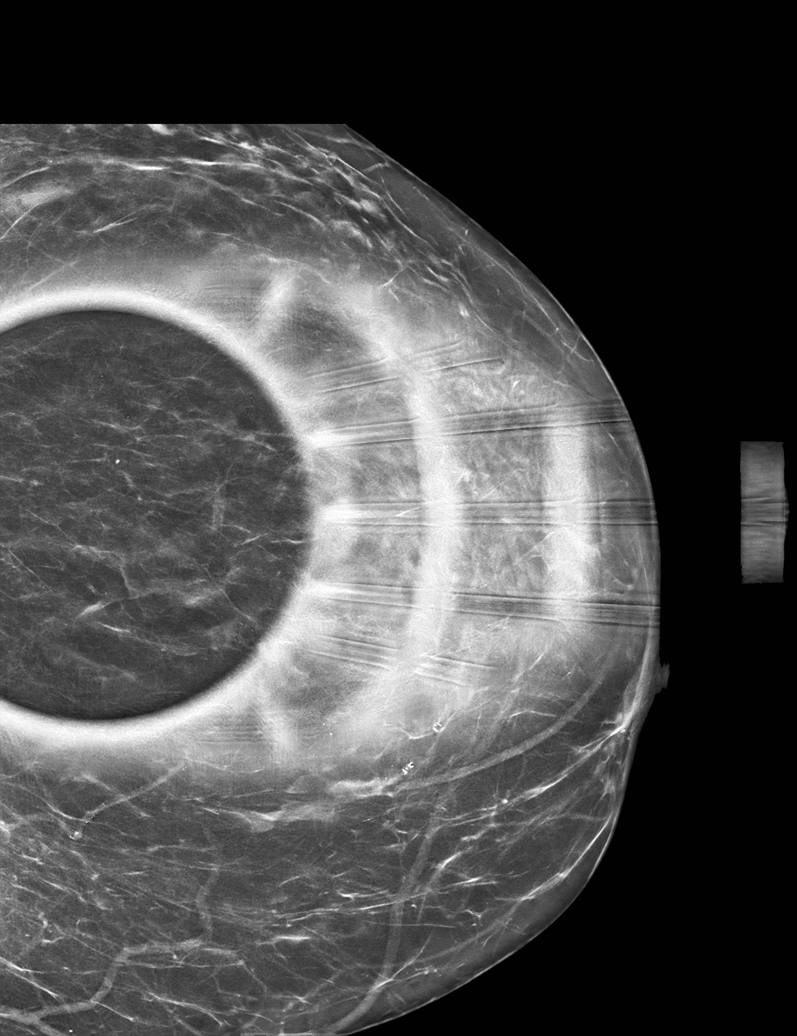

[L MLO synth-2D (1 of 2)]
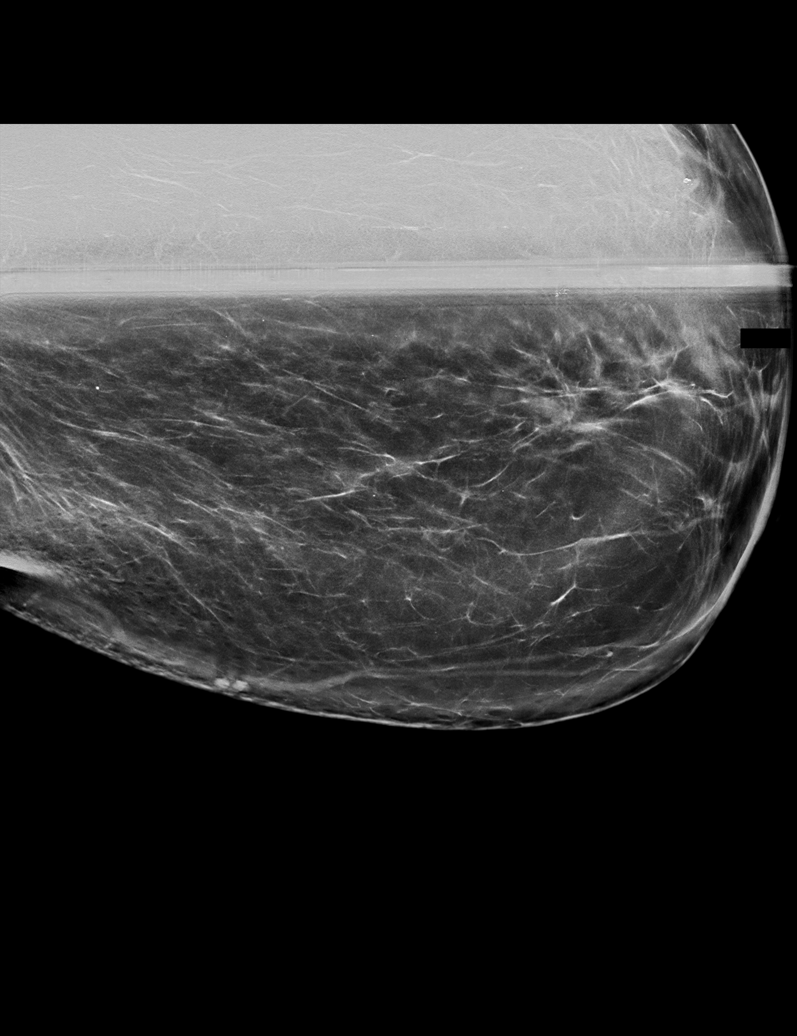

[L ML synth-2D]
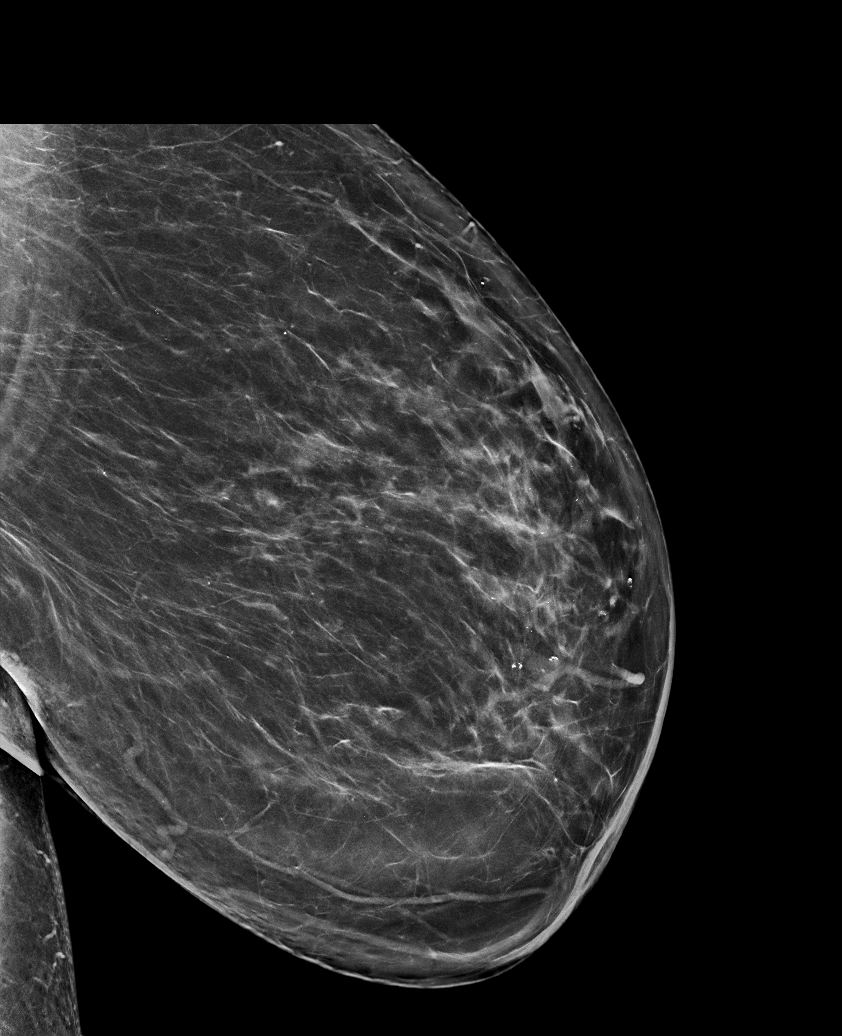

[L MLO synth-2D (2 of 2)]
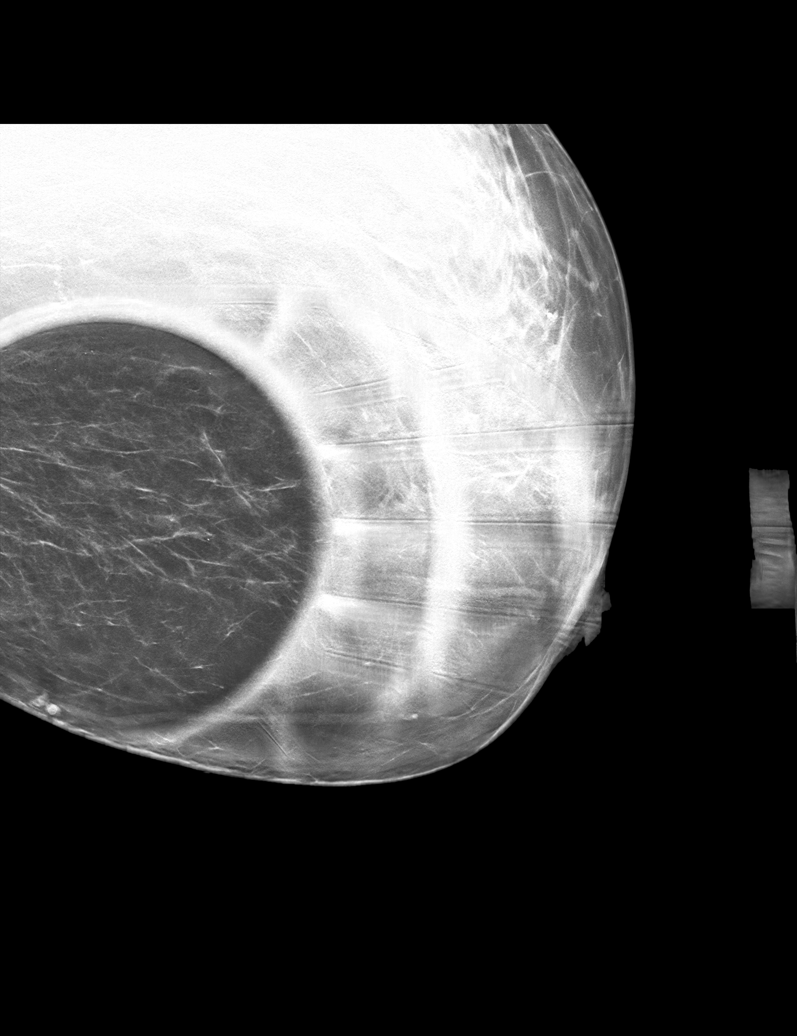

[L CC tomo · tomo slice 38/75.0]
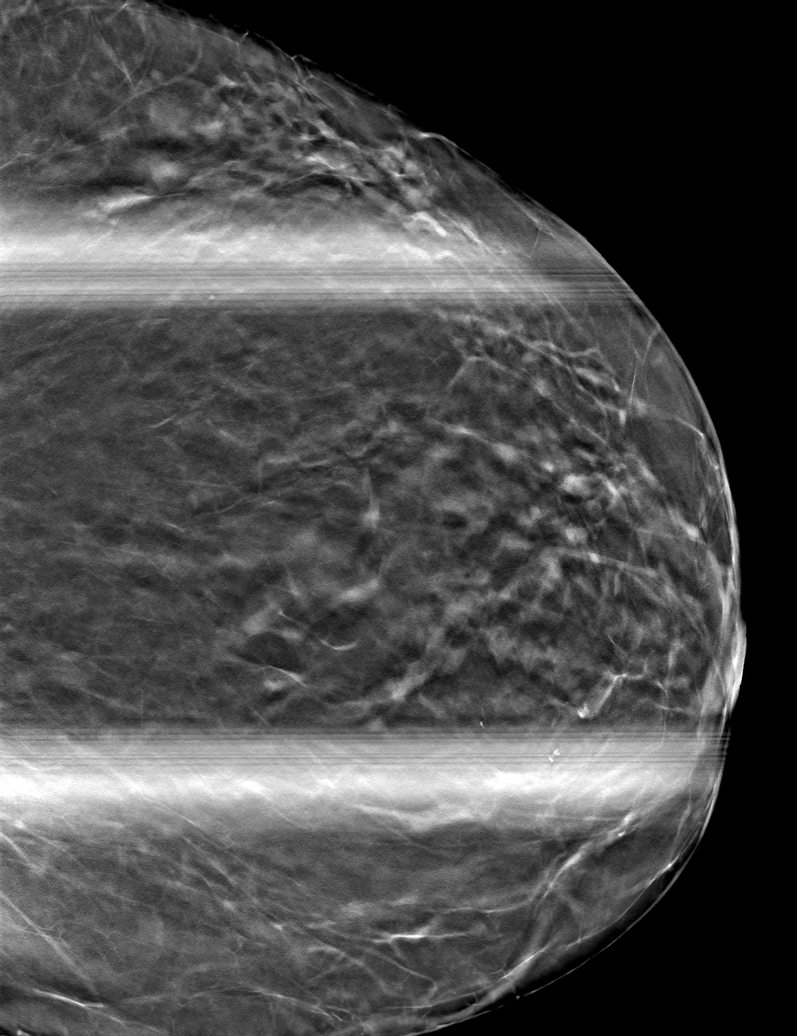

[6 of 30 positions shown; findings below may reference images not displayed]

ACR Breast Density Category b: There are scattered areas of
fibroglandular density.
FINDINGS: Mammogram:

Spot compression tomosynthesis and full true lateral tomosynthesis
views of the left breast were performed demonstrating persistence of
a small oval circumscribed mass measuring approximately 0.6 cm in
the central inferior left breast.

Ultrasound:

Targeted ultrasound is performed in the left breast at 6 o'clock 5
cm from the nipple demonstrating an oval circumscribed anechoic mass
measuring 0.5 x 0.3 x 0.6 cm, consistent with a benign cyst. No
internal vascularity. This corresponds to the mammographic finding.
IMPRESSION: Benign subcentimeter cyst in the central inferior left breast.

RECOMMENDATION:
Screening mammogram in one year.(Code:H2-D-8H0)

I have discussed the findings and recommendations with the patient.
If applicable, a reminder letter will be sent to the patient
regarding the next appointment.

BI-RADS CATEGORY  2: Benign.

## 2022-10-19 DIAGNOSIS — G4733 Obstructive sleep apnea (adult) (pediatric): Secondary | ICD-10-CM | POA: Diagnosis not present

## 2022-11-01 DIAGNOSIS — I10 Essential (primary) hypertension: Secondary | ICD-10-CM | POA: Diagnosis not present

## 2022-11-01 DIAGNOSIS — J069 Acute upper respiratory infection, unspecified: Secondary | ICD-10-CM | POA: Diagnosis not present

## 2022-11-01 DIAGNOSIS — Z299 Encounter for prophylactic measures, unspecified: Secondary | ICD-10-CM | POA: Diagnosis not present

## 2022-11-01 DIAGNOSIS — M25562 Pain in left knee: Secondary | ICD-10-CM | POA: Diagnosis not present

## 2022-11-03 DIAGNOSIS — G4733 Obstructive sleep apnea (adult) (pediatric): Secondary | ICD-10-CM | POA: Diagnosis not present

## 2022-11-19 DIAGNOSIS — G4733 Obstructive sleep apnea (adult) (pediatric): Secondary | ICD-10-CM | POA: Diagnosis not present

## 2022-11-22 DIAGNOSIS — E2839 Other primary ovarian failure: Secondary | ICD-10-CM | POA: Diagnosis not present

## 2022-11-28 DIAGNOSIS — Z299 Encounter for prophylactic measures, unspecified: Secondary | ICD-10-CM | POA: Diagnosis not present

## 2022-11-28 DIAGNOSIS — Z6841 Body Mass Index (BMI) 40.0 and over, adult: Secondary | ICD-10-CM | POA: Diagnosis not present

## 2022-11-28 DIAGNOSIS — J45909 Unspecified asthma, uncomplicated: Secondary | ICD-10-CM | POA: Diagnosis not present

## 2022-11-28 DIAGNOSIS — M25562 Pain in left knee: Secondary | ICD-10-CM | POA: Diagnosis not present

## 2022-11-28 DIAGNOSIS — I10 Essential (primary) hypertension: Secondary | ICD-10-CM | POA: Diagnosis not present

## 2022-11-29 DIAGNOSIS — G4700 Insomnia, unspecified: Secondary | ICD-10-CM | POA: Diagnosis not present

## 2022-11-29 DIAGNOSIS — G4733 Obstructive sleep apnea (adult) (pediatric): Secondary | ICD-10-CM | POA: Diagnosis not present

## 2022-11-29 DIAGNOSIS — Z6841 Body Mass Index (BMI) 40.0 and over, adult: Secondary | ICD-10-CM | POA: Diagnosis not present

## 2022-11-29 DIAGNOSIS — I10 Essential (primary) hypertension: Secondary | ICD-10-CM | POA: Diagnosis not present

## 2022-11-29 DIAGNOSIS — R0981 Nasal congestion: Secondary | ICD-10-CM | POA: Diagnosis not present

## 2022-12-03 DIAGNOSIS — G4733 Obstructive sleep apnea (adult) (pediatric): Secondary | ICD-10-CM | POA: Diagnosis not present

## 2022-12-14 DIAGNOSIS — M5412 Radiculopathy, cervical region: Secondary | ICD-10-CM | POA: Diagnosis not present

## 2022-12-14 DIAGNOSIS — M797 Fibromyalgia: Secondary | ICD-10-CM | POA: Diagnosis not present

## 2022-12-14 DIAGNOSIS — M19072 Primary osteoarthritis, left ankle and foot: Secondary | ICD-10-CM | POA: Diagnosis not present

## 2022-12-19 DIAGNOSIS — G4733 Obstructive sleep apnea (adult) (pediatric): Secondary | ICD-10-CM | POA: Diagnosis not present

## 2023-01-06 DIAGNOSIS — I10 Essential (primary) hypertension: Secondary | ICD-10-CM | POA: Diagnosis not present

## 2023-01-06 DIAGNOSIS — M25562 Pain in left knee: Secondary | ICD-10-CM | POA: Diagnosis not present

## 2023-01-06 DIAGNOSIS — M797 Fibromyalgia: Secondary | ICD-10-CM | POA: Diagnosis not present

## 2023-01-06 DIAGNOSIS — Z299 Encounter for prophylactic measures, unspecified: Secondary | ICD-10-CM | POA: Diagnosis not present

## 2023-01-08 ENCOUNTER — Other Ambulatory Visit: Payer: Self-pay | Admitting: Gastroenterology

## 2023-01-16 DIAGNOSIS — G4733 Obstructive sleep apnea (adult) (pediatric): Secondary | ICD-10-CM | POA: Diagnosis not present

## 2023-01-19 DIAGNOSIS — G4733 Obstructive sleep apnea (adult) (pediatric): Secondary | ICD-10-CM | POA: Diagnosis not present

## 2023-02-19 DIAGNOSIS — G4733 Obstructive sleep apnea (adult) (pediatric): Secondary | ICD-10-CM | POA: Diagnosis not present

## 2023-02-21 DIAGNOSIS — Z299 Encounter for prophylactic measures, unspecified: Secondary | ICD-10-CM | POA: Diagnosis not present

## 2023-02-21 DIAGNOSIS — M19041 Primary osteoarthritis, right hand: Secondary | ICD-10-CM | POA: Diagnosis not present

## 2023-02-21 DIAGNOSIS — M25531 Pain in right wrist: Secondary | ICD-10-CM | POA: Diagnosis not present

## 2023-02-21 DIAGNOSIS — K219 Gastro-esophageal reflux disease without esophagitis: Secondary | ICD-10-CM | POA: Diagnosis not present

## 2023-02-21 DIAGNOSIS — I10 Essential (primary) hypertension: Secondary | ICD-10-CM | POA: Diagnosis not present

## 2023-02-21 DIAGNOSIS — Z87828 Personal history of other (healed) physical injury and trauma: Secondary | ICD-10-CM | POA: Diagnosis not present

## 2023-02-21 DIAGNOSIS — R071 Chest pain on breathing: Secondary | ICD-10-CM | POA: Diagnosis not present

## 2023-02-21 DIAGNOSIS — M79641 Pain in right hand: Secondary | ICD-10-CM | POA: Diagnosis not present

## 2023-02-21 DIAGNOSIS — Z6841 Body Mass Index (BMI) 40.0 and over, adult: Secondary | ICD-10-CM | POA: Diagnosis not present

## 2023-02-21 DIAGNOSIS — M7989 Other specified soft tissue disorders: Secondary | ICD-10-CM | POA: Diagnosis not present

## 2023-03-07 DIAGNOSIS — Z79899 Other long term (current) drug therapy: Secondary | ICD-10-CM | POA: Diagnosis not present

## 2023-03-07 DIAGNOSIS — M797 Fibromyalgia: Secondary | ICD-10-CM | POA: Diagnosis not present

## 2023-03-07 DIAGNOSIS — M5412 Radiculopathy, cervical region: Secondary | ICD-10-CM | POA: Diagnosis not present

## 2023-04-04 DIAGNOSIS — H524 Presbyopia: Secondary | ICD-10-CM | POA: Diagnosis not present

## 2023-04-04 DIAGNOSIS — H25813 Combined forms of age-related cataract, bilateral: Secondary | ICD-10-CM | POA: Diagnosis not present

## 2023-04-17 DIAGNOSIS — G4733 Obstructive sleep apnea (adult) (pediatric): Secondary | ICD-10-CM | POA: Diagnosis not present

## 2023-04-28 DIAGNOSIS — Z299 Encounter for prophylactic measures, unspecified: Secondary | ICD-10-CM | POA: Diagnosis not present

## 2023-04-28 DIAGNOSIS — Z6841 Body Mass Index (BMI) 40.0 and over, adult: Secondary | ICD-10-CM | POA: Diagnosis not present

## 2023-04-28 DIAGNOSIS — M25562 Pain in left knee: Secondary | ICD-10-CM | POA: Diagnosis not present

## 2023-04-28 DIAGNOSIS — I10 Essential (primary) hypertension: Secondary | ICD-10-CM | POA: Diagnosis not present

## 2023-04-28 DIAGNOSIS — M25561 Pain in right knee: Secondary | ICD-10-CM | POA: Diagnosis not present

## 2023-04-28 DIAGNOSIS — R262 Difficulty in walking, not elsewhere classified: Secondary | ICD-10-CM | POA: Diagnosis not present

## 2023-04-28 DIAGNOSIS — M2560 Stiffness of unspecified joint, not elsewhere classified: Secondary | ICD-10-CM | POA: Diagnosis not present

## 2023-05-18 DIAGNOSIS — Z299 Encounter for prophylactic measures, unspecified: Secondary | ICD-10-CM | POA: Diagnosis not present

## 2023-05-18 DIAGNOSIS — I10 Essential (primary) hypertension: Secondary | ICD-10-CM | POA: Diagnosis not present

## 2023-05-18 DIAGNOSIS — M25561 Pain in right knee: Secondary | ICD-10-CM | POA: Diagnosis not present

## 2023-05-18 DIAGNOSIS — M25562 Pain in left knee: Secondary | ICD-10-CM | POA: Diagnosis not present

## 2023-05-26 DIAGNOSIS — Z79899 Other long term (current) drug therapy: Secondary | ICD-10-CM | POA: Diagnosis not present

## 2023-05-26 DIAGNOSIS — Z299 Encounter for prophylactic measures, unspecified: Secondary | ICD-10-CM | POA: Diagnosis not present

## 2023-05-26 DIAGNOSIS — Z1339 Encounter for screening examination for other mental health and behavioral disorders: Secondary | ICD-10-CM | POA: Diagnosis not present

## 2023-05-26 DIAGNOSIS — Z7189 Other specified counseling: Secondary | ICD-10-CM | POA: Diagnosis not present

## 2023-05-26 DIAGNOSIS — Z1331 Encounter for screening for depression: Secondary | ICD-10-CM | POA: Diagnosis not present

## 2023-05-26 DIAGNOSIS — I10 Essential (primary) hypertension: Secondary | ICD-10-CM | POA: Diagnosis not present

## 2023-05-26 DIAGNOSIS — Z0000 Encounter for general adult medical examination without abnormal findings: Secondary | ICD-10-CM | POA: Diagnosis not present

## 2023-05-26 DIAGNOSIS — R5383 Other fatigue: Secondary | ICD-10-CM | POA: Diagnosis not present

## 2023-05-26 DIAGNOSIS — E7800 Pure hypercholesterolemia, unspecified: Secondary | ICD-10-CM | POA: Diagnosis not present

## 2023-05-30 DIAGNOSIS — M25512 Pain in left shoulder: Secondary | ICD-10-CM | POA: Diagnosis not present

## 2023-05-30 DIAGNOSIS — M797 Fibromyalgia: Secondary | ICD-10-CM | POA: Diagnosis not present

## 2023-05-30 DIAGNOSIS — M25511 Pain in right shoulder: Secondary | ICD-10-CM | POA: Diagnosis not present

## 2023-05-30 DIAGNOSIS — M5412 Radiculopathy, cervical region: Secondary | ICD-10-CM | POA: Diagnosis not present

## 2023-05-31 DIAGNOSIS — Z299 Encounter for prophylactic measures, unspecified: Secondary | ICD-10-CM | POA: Diagnosis not present

## 2023-05-31 DIAGNOSIS — M79643 Pain in unspecified hand: Secondary | ICD-10-CM | POA: Diagnosis not present

## 2023-05-31 DIAGNOSIS — I10 Essential (primary) hypertension: Secondary | ICD-10-CM | POA: Diagnosis not present

## 2023-05-31 DIAGNOSIS — M25511 Pain in right shoulder: Secondary | ICD-10-CM | POA: Diagnosis not present

## 2023-05-31 DIAGNOSIS — R0981 Nasal congestion: Secondary | ICD-10-CM | POA: Diagnosis not present

## 2023-05-31 DIAGNOSIS — Z6841 Body Mass Index (BMI) 40.0 and over, adult: Secondary | ICD-10-CM | POA: Diagnosis not present

## 2023-05-31 DIAGNOSIS — G4733 Obstructive sleep apnea (adult) (pediatric): Secondary | ICD-10-CM | POA: Diagnosis not present

## 2023-06-05 DIAGNOSIS — Z0000 Encounter for general adult medical examination without abnormal findings: Secondary | ICD-10-CM | POA: Diagnosis not present

## 2023-06-05 DIAGNOSIS — L4050 Arthropathic psoriasis, unspecified: Secondary | ICD-10-CM | POA: Diagnosis not present

## 2023-06-05 DIAGNOSIS — G4733 Obstructive sleep apnea (adult) (pediatric): Secondary | ICD-10-CM | POA: Diagnosis not present

## 2023-06-05 DIAGNOSIS — Z6841 Body Mass Index (BMI) 40.0 and over, adult: Secondary | ICD-10-CM | POA: Diagnosis not present

## 2023-06-05 DIAGNOSIS — Z299 Encounter for prophylactic measures, unspecified: Secondary | ICD-10-CM | POA: Diagnosis not present

## 2023-06-05 DIAGNOSIS — I10 Essential (primary) hypertension: Secondary | ICD-10-CM | POA: Diagnosis not present

## 2023-06-16 DIAGNOSIS — Z299 Encounter for prophylactic measures, unspecified: Secondary | ICD-10-CM | POA: Diagnosis not present

## 2023-06-16 DIAGNOSIS — I10 Essential (primary) hypertension: Secondary | ICD-10-CM | POA: Diagnosis not present

## 2023-06-16 DIAGNOSIS — M25571 Pain in right ankle and joints of right foot: Secondary | ICD-10-CM | POA: Diagnosis not present

## 2023-06-16 DIAGNOSIS — S93401A Sprain of unspecified ligament of right ankle, initial encounter: Secondary | ICD-10-CM | POA: Diagnosis not present

## 2023-07-06 ENCOUNTER — Other Ambulatory Visit: Payer: Self-pay | Admitting: Internal Medicine

## 2023-07-06 DIAGNOSIS — Z1231 Encounter for screening mammogram for malignant neoplasm of breast: Secondary | ICD-10-CM

## 2023-07-18 DIAGNOSIS — G4733 Obstructive sleep apnea (adult) (pediatric): Secondary | ICD-10-CM | POA: Diagnosis not present

## 2023-08-08 ENCOUNTER — Ambulatory Visit: Admission: RE | Admit: 2023-08-08 | Payer: Medicare PPO | Source: Ambulatory Visit

## 2023-08-08 DIAGNOSIS — Z1231 Encounter for screening mammogram for malignant neoplasm of breast: Secondary | ICD-10-CM

## 2023-08-18 DIAGNOSIS — L989 Disorder of the skin and subcutaneous tissue, unspecified: Secondary | ICD-10-CM | POA: Diagnosis not present

## 2023-08-18 DIAGNOSIS — R21 Rash and other nonspecific skin eruption: Secondary | ICD-10-CM | POA: Diagnosis not present

## 2023-08-18 DIAGNOSIS — I10 Essential (primary) hypertension: Secondary | ICD-10-CM | POA: Diagnosis not present

## 2023-08-18 DIAGNOSIS — Z299 Encounter for prophylactic measures, unspecified: Secondary | ICD-10-CM | POA: Diagnosis not present

## 2023-08-18 DIAGNOSIS — L570 Actinic keratosis: Secondary | ICD-10-CM | POA: Diagnosis not present

## 2023-08-23 DIAGNOSIS — M25512 Pain in left shoulder: Secondary | ICD-10-CM | POA: Diagnosis not present

## 2023-08-23 DIAGNOSIS — M25511 Pain in right shoulder: Secondary | ICD-10-CM | POA: Diagnosis not present

## 2023-08-23 DIAGNOSIS — M797 Fibromyalgia: Secondary | ICD-10-CM | POA: Diagnosis not present

## 2023-09-05 DIAGNOSIS — M160 Bilateral primary osteoarthritis of hip: Secondary | ICD-10-CM | POA: Diagnosis not present

## 2023-09-05 DIAGNOSIS — I10 Essential (primary) hypertension: Secondary | ICD-10-CM | POA: Diagnosis not present

## 2023-09-05 DIAGNOSIS — M25552 Pain in left hip: Secondary | ICD-10-CM | POA: Diagnosis not present

## 2023-09-05 DIAGNOSIS — Z299 Encounter for prophylactic measures, unspecified: Secondary | ICD-10-CM | POA: Diagnosis not present

## 2023-09-27 DIAGNOSIS — G4700 Insomnia, unspecified: Secondary | ICD-10-CM | POA: Diagnosis not present

## 2023-09-27 DIAGNOSIS — R52 Pain, unspecified: Secondary | ICD-10-CM | POA: Diagnosis not present

## 2023-09-27 DIAGNOSIS — Z2821 Immunization not carried out because of patient refusal: Secondary | ICD-10-CM | POA: Diagnosis not present

## 2023-09-27 DIAGNOSIS — M25552 Pain in left hip: Secondary | ICD-10-CM | POA: Diagnosis not present

## 2023-09-27 DIAGNOSIS — G4733 Obstructive sleep apnea (adult) (pediatric): Secondary | ICD-10-CM | POA: Diagnosis not present

## 2023-09-27 DIAGNOSIS — I10 Essential (primary) hypertension: Secondary | ICD-10-CM | POA: Diagnosis not present

## 2023-09-27 DIAGNOSIS — L4050 Arthropathic psoriasis, unspecified: Secondary | ICD-10-CM | POA: Diagnosis not present

## 2023-09-27 DIAGNOSIS — Z299 Encounter for prophylactic measures, unspecified: Secondary | ICD-10-CM | POA: Diagnosis not present

## 2023-10-03 DIAGNOSIS — G4733 Obstructive sleep apnea (adult) (pediatric): Secondary | ICD-10-CM | POA: Diagnosis not present

## 2023-10-03 DIAGNOSIS — M797 Fibromyalgia: Secondary | ICD-10-CM | POA: Diagnosis not present

## 2023-10-03 DIAGNOSIS — I10 Essential (primary) hypertension: Secondary | ICD-10-CM | POA: Diagnosis not present

## 2023-10-03 DIAGNOSIS — Z299 Encounter for prophylactic measures, unspecified: Secondary | ICD-10-CM | POA: Diagnosis not present

## 2023-10-20 DIAGNOSIS — G4733 Obstructive sleep apnea (adult) (pediatric): Secondary | ICD-10-CM | POA: Diagnosis not present

## 2023-11-13 DIAGNOSIS — I10 Essential (primary) hypertension: Secondary | ICD-10-CM | POA: Diagnosis not present

## 2023-11-13 DIAGNOSIS — G4700 Insomnia, unspecified: Secondary | ICD-10-CM | POA: Diagnosis not present

## 2023-11-13 DIAGNOSIS — R52 Pain, unspecified: Secondary | ICD-10-CM | POA: Diagnosis not present

## 2023-11-13 DIAGNOSIS — Z299 Encounter for prophylactic measures, unspecified: Secondary | ICD-10-CM | POA: Diagnosis not present

## 2023-11-13 DIAGNOSIS — L4050 Arthropathic psoriasis, unspecified: Secondary | ICD-10-CM | POA: Diagnosis not present

## 2023-11-13 DIAGNOSIS — M25562 Pain in left knee: Secondary | ICD-10-CM | POA: Diagnosis not present

## 2023-11-20 DIAGNOSIS — M25512 Pain in left shoulder: Secondary | ICD-10-CM | POA: Diagnosis not present

## 2023-11-20 DIAGNOSIS — M797 Fibromyalgia: Secondary | ICD-10-CM | POA: Diagnosis not present

## 2023-11-20 DIAGNOSIS — M25511 Pain in right shoulder: Secondary | ICD-10-CM | POA: Diagnosis not present

## 2023-11-30 DIAGNOSIS — R350 Frequency of micturition: Secondary | ICD-10-CM | POA: Diagnosis not present

## 2023-11-30 DIAGNOSIS — I10 Essential (primary) hypertension: Secondary | ICD-10-CM | POA: Diagnosis not present

## 2023-11-30 DIAGNOSIS — R52 Pain, unspecified: Secondary | ICD-10-CM | POA: Diagnosis not present

## 2023-11-30 DIAGNOSIS — L4050 Arthropathic psoriasis, unspecified: Secondary | ICD-10-CM | POA: Diagnosis not present

## 2023-11-30 DIAGNOSIS — Z299 Encounter for prophylactic measures, unspecified: Secondary | ICD-10-CM | POA: Diagnosis not present

## 2023-11-30 DIAGNOSIS — R3589 Other polyuria: Secondary | ICD-10-CM | POA: Diagnosis not present

## 2023-12-28 DIAGNOSIS — H1010 Acute atopic conjunctivitis, unspecified eye: Secondary | ICD-10-CM | POA: Diagnosis not present

## 2023-12-28 DIAGNOSIS — Z299 Encounter for prophylactic measures, unspecified: Secondary | ICD-10-CM | POA: Diagnosis not present

## 2023-12-28 DIAGNOSIS — R52 Pain, unspecified: Secondary | ICD-10-CM | POA: Diagnosis not present

## 2023-12-28 DIAGNOSIS — J309 Allergic rhinitis, unspecified: Secondary | ICD-10-CM | POA: Diagnosis not present

## 2023-12-28 DIAGNOSIS — M797 Fibromyalgia: Secondary | ICD-10-CM | POA: Diagnosis not present

## 2023-12-28 DIAGNOSIS — I10 Essential (primary) hypertension: Secondary | ICD-10-CM | POA: Diagnosis not present

## 2024-01-03 DIAGNOSIS — M2560 Stiffness of unspecified joint, not elsewhere classified: Secondary | ICD-10-CM | POA: Diagnosis not present

## 2024-01-03 DIAGNOSIS — Z299 Encounter for prophylactic measures, unspecified: Secondary | ICD-10-CM | POA: Diagnosis not present

## 2024-01-03 DIAGNOSIS — R262 Difficulty in walking, not elsewhere classified: Secondary | ICD-10-CM | POA: Diagnosis not present

## 2024-01-03 DIAGNOSIS — M25562 Pain in left knee: Secondary | ICD-10-CM | POA: Diagnosis not present

## 2024-01-03 DIAGNOSIS — R269 Unspecified abnormalities of gait and mobility: Secondary | ICD-10-CM | POA: Diagnosis not present

## 2024-01-03 DIAGNOSIS — M1712 Unilateral primary osteoarthritis, left knee: Secondary | ICD-10-CM | POA: Diagnosis not present

## 2024-01-03 DIAGNOSIS — R260 Ataxic gait: Secondary | ICD-10-CM | POA: Diagnosis not present

## 2024-01-03 DIAGNOSIS — I10 Essential (primary) hypertension: Secondary | ICD-10-CM | POA: Diagnosis not present

## 2024-01-03 DIAGNOSIS — M25861 Other specified joint disorders, right knee: Secondary | ICD-10-CM | POA: Diagnosis not present

## 2024-01-03 DIAGNOSIS — M25862 Other specified joint disorders, left knee: Secondary | ICD-10-CM | POA: Diagnosis not present

## 2024-01-03 DIAGNOSIS — M25561 Pain in right knee: Secondary | ICD-10-CM | POA: Diagnosis not present

## 2024-01-04 DIAGNOSIS — H10011 Acute follicular conjunctivitis, right eye: Secondary | ICD-10-CM | POA: Diagnosis not present

## 2024-01-19 DIAGNOSIS — G4733 Obstructive sleep apnea (adult) (pediatric): Secondary | ICD-10-CM | POA: Diagnosis not present

## 2024-01-25 DIAGNOSIS — M79672 Pain in left foot: Secondary | ICD-10-CM | POA: Diagnosis not present

## 2024-01-25 DIAGNOSIS — M797 Fibromyalgia: Secondary | ICD-10-CM | POA: Diagnosis not present

## 2024-01-25 DIAGNOSIS — Z299 Encounter for prophylactic measures, unspecified: Secondary | ICD-10-CM | POA: Diagnosis not present

## 2024-01-25 DIAGNOSIS — R232 Flushing: Secondary | ICD-10-CM | POA: Diagnosis not present

## 2024-01-25 DIAGNOSIS — I10 Essential (primary) hypertension: Secondary | ICD-10-CM | POA: Diagnosis not present

## 2024-01-31 DIAGNOSIS — I10 Essential (primary) hypertension: Secondary | ICD-10-CM | POA: Diagnosis not present

## 2024-01-31 DIAGNOSIS — Z299 Encounter for prophylactic measures, unspecified: Secondary | ICD-10-CM | POA: Diagnosis not present

## 2024-01-31 DIAGNOSIS — M1711 Unilateral primary osteoarthritis, right knee: Secondary | ICD-10-CM | POA: Diagnosis not present

## 2024-02-19 DIAGNOSIS — M797 Fibromyalgia: Secondary | ICD-10-CM | POA: Diagnosis not present

## 2024-02-19 DIAGNOSIS — M5412 Radiculopathy, cervical region: Secondary | ICD-10-CM | POA: Diagnosis not present

## 2024-02-19 DIAGNOSIS — M25511 Pain in right shoulder: Secondary | ICD-10-CM | POA: Diagnosis not present

## 2024-02-19 DIAGNOSIS — Z79899 Other long term (current) drug therapy: Secondary | ICD-10-CM | POA: Diagnosis not present

## 2024-02-19 DIAGNOSIS — M25512 Pain in left shoulder: Secondary | ICD-10-CM | POA: Diagnosis not present

## 2024-02-28 DIAGNOSIS — Z299 Encounter for prophylactic measures, unspecified: Secondary | ICD-10-CM | POA: Diagnosis not present

## 2024-02-28 DIAGNOSIS — I10 Essential (primary) hypertension: Secondary | ICD-10-CM | POA: Diagnosis not present

## 2024-02-28 DIAGNOSIS — G4733 Obstructive sleep apnea (adult) (pediatric): Secondary | ICD-10-CM | POA: Diagnosis not present

## 2024-02-28 DIAGNOSIS — L4050 Arthropathic psoriasis, unspecified: Secondary | ICD-10-CM | POA: Diagnosis not present

## 2024-02-28 DIAGNOSIS — M797 Fibromyalgia: Secondary | ICD-10-CM | POA: Diagnosis not present

## 2024-03-01 DIAGNOSIS — I10 Essential (primary) hypertension: Secondary | ICD-10-CM | POA: Diagnosis not present

## 2024-03-01 DIAGNOSIS — M797 Fibromyalgia: Secondary | ICD-10-CM | POA: Diagnosis not present

## 2024-03-01 DIAGNOSIS — R52 Pain, unspecified: Secondary | ICD-10-CM | POA: Diagnosis not present

## 2024-03-01 DIAGNOSIS — Z299 Encounter for prophylactic measures, unspecified: Secondary | ICD-10-CM | POA: Diagnosis not present

## 2024-03-01 DIAGNOSIS — G5600 Carpal tunnel syndrome, unspecified upper limb: Secondary | ICD-10-CM | POA: Diagnosis not present

## 2024-03-29 DIAGNOSIS — Z6839 Body mass index (BMI) 39.0-39.9, adult: Secondary | ICD-10-CM | POA: Diagnosis not present

## 2024-03-29 DIAGNOSIS — R21 Rash and other nonspecific skin eruption: Secondary | ICD-10-CM | POA: Diagnosis not present

## 2024-03-29 DIAGNOSIS — I10 Essential (primary) hypertension: Secondary | ICD-10-CM | POA: Diagnosis not present

## 2024-03-29 DIAGNOSIS — Z299 Encounter for prophylactic measures, unspecified: Secondary | ICD-10-CM | POA: Diagnosis not present

## 2024-04-03 DIAGNOSIS — H25813 Combined forms of age-related cataract, bilateral: Secondary | ICD-10-CM | POA: Diagnosis not present

## 2024-04-03 DIAGNOSIS — H524 Presbyopia: Secondary | ICD-10-CM | POA: Diagnosis not present

## 2024-04-19 DIAGNOSIS — G4733 Obstructive sleep apnea (adult) (pediatric): Secondary | ICD-10-CM | POA: Diagnosis not present

## 2024-04-30 DIAGNOSIS — Z0000 Encounter for general adult medical examination without abnormal findings: Secondary | ICD-10-CM | POA: Diagnosis not present

## 2024-04-30 DIAGNOSIS — R52 Pain, unspecified: Secondary | ICD-10-CM | POA: Diagnosis not present

## 2024-04-30 DIAGNOSIS — Z7189 Other specified counseling: Secondary | ICD-10-CM | POA: Diagnosis not present

## 2024-04-30 DIAGNOSIS — I10 Essential (primary) hypertension: Secondary | ICD-10-CM | POA: Diagnosis not present

## 2024-04-30 DIAGNOSIS — Z299 Encounter for prophylactic measures, unspecified: Secondary | ICD-10-CM | POA: Diagnosis not present

## 2024-04-30 DIAGNOSIS — Z1331 Encounter for screening for depression: Secondary | ICD-10-CM | POA: Diagnosis not present

## 2024-04-30 DIAGNOSIS — Z1339 Encounter for screening examination for other mental health and behavioral disorders: Secondary | ICD-10-CM | POA: Diagnosis not present

## 2024-04-30 DIAGNOSIS — L4050 Arthropathic psoriasis, unspecified: Secondary | ICD-10-CM | POA: Diagnosis not present

## 2024-05-01 ENCOUNTER — Telehealth: Payer: Self-pay

## 2024-05-01 DIAGNOSIS — I10 Essential (primary) hypertension: Secondary | ICD-10-CM | POA: Diagnosis not present

## 2024-05-01 DIAGNOSIS — R52 Pain, unspecified: Secondary | ICD-10-CM | POA: Diagnosis not present

## 2024-05-01 DIAGNOSIS — K219 Gastro-esophageal reflux disease without esophagitis: Secondary | ICD-10-CM

## 2024-05-01 DIAGNOSIS — Z299 Encounter for prophylactic measures, unspecified: Secondary | ICD-10-CM | POA: Diagnosis not present

## 2024-05-01 DIAGNOSIS — M654 Radial styloid tenosynovitis [de Quervain]: Secondary | ICD-10-CM | POA: Diagnosis not present

## 2024-05-01 DIAGNOSIS — L4050 Arthropathic psoriasis, unspecified: Secondary | ICD-10-CM | POA: Diagnosis not present

## 2024-05-07 ENCOUNTER — Telehealth: Payer: Self-pay | Admitting: *Deleted

## 2024-05-07 NOTE — Progress Notes (Signed)
 Complex Care Management Note  Care Guide Note 05/07/2024 Name: [name] MRN: [id number] DOB: 02-25-1956  Amanda Hutchinson is a 68 y.o. year old female who sees Theoplis Fix, MD for primary care. I reached out to Barron Lien by phone today to offer complex care management services.  [name] was given information about Complex Care Management services today including:   The Complex Care Management services include support from the care team which includes your Nurse Care Manager, Clinical Social Worker, or Pharmacist.  The Complex Care Management team is here to help remove barriers to the health concerns and goals most important to you. Complex Care Management services are voluntary, and the patient may decline or stop services at any time by request to their care team member.   Complex Care Management Consent Status: Patient agreed to services and verbal consent obtained.   Follow up plan:  Telephone appointment with complex care management team member scheduled for:  5/15  Encounter Outcome:  Patient Scheduled Barnie Bora  [facility]  [facility], [facility] Guide  Direct Dial: [phone]  Fax [phone]

## 2024-05-09 ENCOUNTER — Other Ambulatory Visit: Payer: Self-pay

## 2024-05-09 NOTE — Patient Instructions (Signed)
 Visit Information  Thank you for taking time to visit with me today. Please don't hesitate to contact me if I can be of assistance to you before our next scheduled appointment.    Following is a copy of your care plan:   Goals Addressed             This Visit's Progress    [facility] Social Work Care Plan-BSW       Problems:   Food Insecurity  and Housing   CSW Clinical Goal(s):   Patient will communicate with family regarding cleaning gutters and complete Advanced Directive paperwork.  Interventions:  Social Determinants of Health in Patient with none: SDOH assessments completed: Food Insecurity  and Housing  Evaluation of current treatment plan related to unmet needs Patient is familiar with food banks but declined referral.   Patient Goals/Self-Care Activities:  Patient will follow up with family to assist with cleaning gutters.  Plan:   No further follow up required: Patient does have request a follow up visit.        Please call 911 if you are experiencing a Mental Health or Behavioral Health Crisis or need someone to talk to.  Patient verbalizes understanding of instructions and care plan provided today and agrees to view in MyChart. Active MyChart status and patient understanding of how to access instructions and care plan via MyChart confirmed with patient.     Dallis Dues, BSW [place]  [facility], [facility] Social Worker Direct Dial: [phone]  Fax: [phone] Website: [email or web address]

## 2024-05-09 NOTE — Patient Outreach (Addendum)
 Complex Care Management   Visit Note  05/09/2024  Name:  Amanda Hutchinson MRN: [id number] DOB: May 09, 1956  Situation: Referral received for Complex Care Management related to SDOH Barriers:  Housing cleaning gutters Food insecurity I obtained verbal consent from Patient.  Visit completed with patient  on the phone  Background:   Past Medical History:  Diagnosis Date   BMI 40.0-44.9, adult (HCC)    Hypertension    Sleep apnea     Assessment:  Patient reports she receives Foodstamps $23, [place] $7846 and OTC benefit $50.  Patient owns her home and struggles with cleaning the gutters.  Family has helped in the past but now have medical issues.  SW does suggest patient speak to other family members to help.   Patient does not completely run out of food but gets low before her benefits arrive. Patient has used [employer] but due to expiration dates, she was unable to eat the food before expiration.  Therefore patient declines foodbank options.  SW t/c [employer] and patient is only eligible to the $50 OTC.  SW agreed to mail Advanced Directive forms for review.  SDOH Interventions    Flowsheet Row Patient Outreach Telephone from 05/09/2024 in [facility]  SDOH Interventions   Food Insecurity Interventions Other (Comment)  [Receive food stamps]  Housing Interventions Intervention Not Indicated  Transportation Interventions Intervention Not Indicated  [Has a car]  Utilities Interventions Intervention Not Indicated  Financial Strain Interventions Intervention Not Indicated  [Does have additional income for home maintence and extra items]         Recommendation:   No medical recommendations at this time.  Follow Up Plan:   Patient has met all care management goals. Care Management case will be closed. Patient has been provided contact information should new needs arise.   Dallis Dues, BSW [place]  [facility], [facility] Social Worker Direct Dial: [phone]  Fax: [phone] Website: [email or web address]

## 2024-05-15 DIAGNOSIS — M797 Fibromyalgia: Secondary | ICD-10-CM | POA: Diagnosis not present

## 2024-05-15 DIAGNOSIS — M5412 Radiculopathy, cervical region: Secondary | ICD-10-CM | POA: Diagnosis not present

## 2024-05-27 DIAGNOSIS — M25579 Pain in unspecified ankle and joints of unspecified foot: Secondary | ICD-10-CM | POA: Diagnosis not present

## 2024-05-27 DIAGNOSIS — R52 Pain, unspecified: Secondary | ICD-10-CM | POA: Diagnosis not present

## 2024-05-27 DIAGNOSIS — Z299 Encounter for prophylactic measures, unspecified: Secondary | ICD-10-CM | POA: Diagnosis not present

## 2024-05-27 DIAGNOSIS — M797 Fibromyalgia: Secondary | ICD-10-CM | POA: Diagnosis not present

## 2024-05-27 DIAGNOSIS — I10 Essential (primary) hypertension: Secondary | ICD-10-CM | POA: Diagnosis not present

## 2024-05-27 DIAGNOSIS — M10071 Idiopathic gout, right ankle and foot: Secondary | ICD-10-CM | POA: Diagnosis not present

## 2024-05-27 DIAGNOSIS — Z6839 Body mass index (BMI) 39.0-39.9, adult: Secondary | ICD-10-CM | POA: Diagnosis not present

## 2024-05-30 DIAGNOSIS — I10 Essential (primary) hypertension: Secondary | ICD-10-CM | POA: Diagnosis not present

## 2024-05-30 DIAGNOSIS — Z6839 Body mass index (BMI) 39.0-39.9, adult: Secondary | ICD-10-CM | POA: Diagnosis not present

## 2024-05-30 DIAGNOSIS — G4733 Obstructive sleep apnea (adult) (pediatric): Secondary | ICD-10-CM | POA: Diagnosis not present

## 2024-05-30 DIAGNOSIS — Z6841 Body Mass Index (BMI) 40.0 and over, adult: Secondary | ICD-10-CM | POA: Diagnosis not present

## 2024-05-30 DIAGNOSIS — K219 Gastro-esophageal reflux disease without esophagitis: Secondary | ICD-10-CM | POA: Diagnosis not present

## 2024-06-05 DIAGNOSIS — M109 Gout, unspecified: Secondary | ICD-10-CM | POA: Diagnosis not present

## 2024-06-05 DIAGNOSIS — I10 Essential (primary) hypertension: Secondary | ICD-10-CM | POA: Diagnosis not present

## 2024-06-05 DIAGNOSIS — M797 Fibromyalgia: Secondary | ICD-10-CM | POA: Diagnosis not present

## 2024-06-05 DIAGNOSIS — R52 Pain, unspecified: Secondary | ICD-10-CM | POA: Diagnosis not present

## 2024-06-05 DIAGNOSIS — Z6839 Body mass index (BMI) 39.0-39.9, adult: Secondary | ICD-10-CM | POA: Diagnosis not present

## 2024-06-05 DIAGNOSIS — Z0000 Encounter for general adult medical examination without abnormal findings: Secondary | ICD-10-CM | POA: Diagnosis not present

## 2024-06-05 DIAGNOSIS — Z299 Encounter for prophylactic measures, unspecified: Secondary | ICD-10-CM | POA: Diagnosis not present

## 2024-07-03 ENCOUNTER — Other Ambulatory Visit: Payer: Self-pay | Admitting: Internal Medicine

## 2024-07-03 DIAGNOSIS — Z1231 Encounter for screening mammogram for malignant neoplasm of breast: Secondary | ICD-10-CM

## 2024-07-12 DIAGNOSIS — M542 Cervicalgia: Secondary | ICD-10-CM | POA: Diagnosis not present

## 2024-07-12 DIAGNOSIS — I10 Essential (primary) hypertension: Secondary | ICD-10-CM | POA: Diagnosis not present

## 2024-07-12 DIAGNOSIS — R21 Rash and other nonspecific skin eruption: Secondary | ICD-10-CM | POA: Diagnosis not present

## 2024-07-12 DIAGNOSIS — K5909 Other constipation: Secondary | ICD-10-CM | POA: Diagnosis not present

## 2024-07-12 DIAGNOSIS — Z299 Encounter for prophylactic measures, unspecified: Secondary | ICD-10-CM | POA: Diagnosis not present

## 2024-08-08 ENCOUNTER — Ambulatory Visit

## 2024-08-14 DIAGNOSIS — M5412 Radiculopathy, cervical region: Secondary | ICD-10-CM | POA: Diagnosis not present

## 2024-08-14 DIAGNOSIS — M797 Fibromyalgia: Secondary | ICD-10-CM | POA: Diagnosis not present

## 2024-08-15 DIAGNOSIS — Z299 Encounter for prophylactic measures, unspecified: Secondary | ICD-10-CM | POA: Diagnosis not present

## 2024-08-15 DIAGNOSIS — J329 Chronic sinusitis, unspecified: Secondary | ICD-10-CM | POA: Diagnosis not present

## 2024-08-15 DIAGNOSIS — Z6838 Body mass index (BMI) 38.0-38.9, adult: Secondary | ICD-10-CM | POA: Diagnosis not present

## 2024-08-15 DIAGNOSIS — R21 Rash and other nonspecific skin eruption: Secondary | ICD-10-CM | POA: Diagnosis not present

## 2024-08-15 DIAGNOSIS — I10 Essential (primary) hypertension: Secondary | ICD-10-CM | POA: Diagnosis not present

## 2024-08-27 ENCOUNTER — Ambulatory Visit

## 2024-09-04 DIAGNOSIS — Z299 Encounter for prophylactic measures, unspecified: Secondary | ICD-10-CM | POA: Diagnosis not present

## 2024-09-04 DIAGNOSIS — Z6838 Body mass index (BMI) 38.0-38.9, adult: Secondary | ICD-10-CM | POA: Diagnosis not present

## 2024-09-04 DIAGNOSIS — R52 Pain, unspecified: Secondary | ICD-10-CM | POA: Diagnosis not present

## 2024-09-04 DIAGNOSIS — I10 Essential (primary) hypertension: Secondary | ICD-10-CM | POA: Diagnosis not present

## 2024-09-04 DIAGNOSIS — Z713 Dietary counseling and surveillance: Secondary | ICD-10-CM | POA: Diagnosis not present

## 2024-09-04 DIAGNOSIS — G4733 Obstructive sleep apnea (adult) (pediatric): Secondary | ICD-10-CM | POA: Diagnosis not present

## 2024-09-04 DIAGNOSIS — R634 Abnormal weight loss: Secondary | ICD-10-CM | POA: Diagnosis not present

## 2024-09-13 DIAGNOSIS — R001 Bradycardia, unspecified: Secondary | ICD-10-CM | POA: Diagnosis not present

## 2024-09-13 DIAGNOSIS — I10 Essential (primary) hypertension: Secondary | ICD-10-CM | POA: Diagnosis not present

## 2024-09-13 DIAGNOSIS — Z299 Encounter for prophylactic measures, unspecified: Secondary | ICD-10-CM | POA: Diagnosis not present

## 2024-09-13 DIAGNOSIS — R52 Pain, unspecified: Secondary | ICD-10-CM | POA: Diagnosis not present

## 2024-09-13 DIAGNOSIS — Z6838 Body mass index (BMI) 38.0-38.9, adult: Secondary | ICD-10-CM | POA: Diagnosis not present

## 2024-09-13 DIAGNOSIS — R232 Flushing: Secondary | ICD-10-CM | POA: Diagnosis not present

## 2024-09-17 ENCOUNTER — Ambulatory Visit
Admission: RE | Admit: 2024-09-17 | Discharge: 2024-09-17 | Disposition: A | Discharge status: Home / Self Care | Source: Ambulatory Visit | Attending: Internal Medicine | Admitting: Internal Medicine

## 2024-09-17 ENCOUNTER — Ambulatory Visit

## 2024-09-17 DIAGNOSIS — Z1231 Encounter for screening mammogram for malignant neoplasm of breast: Secondary | ICD-10-CM | POA: Diagnosis not present

## 2024-09-19 DIAGNOSIS — I10 Essential (primary) hypertension: Secondary | ICD-10-CM | POA: Diagnosis not present

## 2024-09-19 DIAGNOSIS — Z299 Encounter for prophylactic measures, unspecified: Secondary | ICD-10-CM | POA: Diagnosis not present

## 2024-09-19 DIAGNOSIS — Z6838 Body mass index (BMI) 38.0-38.9, adult: Secondary | ICD-10-CM | POA: Diagnosis not present

## 2024-09-19 DIAGNOSIS — L4050 Arthropathic psoriasis, unspecified: Secondary | ICD-10-CM | POA: Diagnosis not present

## 2024-10-16 DIAGNOSIS — I10 Essential (primary) hypertension: Secondary | ICD-10-CM | POA: Diagnosis not present

## 2024-10-16 DIAGNOSIS — M797 Fibromyalgia: Secondary | ICD-10-CM | POA: Diagnosis not present

## 2024-10-16 DIAGNOSIS — R52 Pain, unspecified: Secondary | ICD-10-CM | POA: Diagnosis not present

## 2024-10-16 DIAGNOSIS — L4050 Arthropathic psoriasis, unspecified: Secondary | ICD-10-CM | POA: Diagnosis not present

## 2024-10-16 DIAGNOSIS — Z299 Encounter for prophylactic measures, unspecified: Secondary | ICD-10-CM | POA: Diagnosis not present

## 2024-11-12 DIAGNOSIS — M797 Fibromyalgia: Secondary | ICD-10-CM | POA: Diagnosis not present

## 2024-11-12 DIAGNOSIS — M5412 Radiculopathy, cervical region: Secondary | ICD-10-CM | POA: Diagnosis not present

## 2024-11-20 DIAGNOSIS — Z299 Encounter for prophylactic measures, unspecified: Secondary | ICD-10-CM | POA: Diagnosis not present

## 2024-11-20 DIAGNOSIS — K589 Irritable bowel syndrome without diarrhea: Secondary | ICD-10-CM | POA: Diagnosis not present

## 2024-11-20 DIAGNOSIS — I10 Essential (primary) hypertension: Secondary | ICD-10-CM | POA: Diagnosis not present

## 2024-11-20 DIAGNOSIS — R143 Flatulence: Secondary | ICD-10-CM | POA: Diagnosis not present

## 2024-11-27 DIAGNOSIS — R143 Flatulence: Secondary | ICD-10-CM | POA: Diagnosis not present

## 2024-11-27 DIAGNOSIS — I10 Essential (primary) hypertension: Secondary | ICD-10-CM | POA: Diagnosis not present

## 2024-11-27 DIAGNOSIS — R52 Pain, unspecified: Secondary | ICD-10-CM | POA: Diagnosis not present

## 2024-11-27 DIAGNOSIS — Z6841 Body Mass Index (BMI) 40.0 and over, adult: Secondary | ICD-10-CM | POA: Diagnosis not present

## 2024-11-27 DIAGNOSIS — M542 Cervicalgia: Secondary | ICD-10-CM | POA: Diagnosis not present

## 2024-11-27 DIAGNOSIS — Z713 Dietary counseling and surveillance: Secondary | ICD-10-CM | POA: Diagnosis not present

## 2024-11-27 DIAGNOSIS — Z299 Encounter for prophylactic measures, unspecified: Secondary | ICD-10-CM | POA: Diagnosis not present
